# Patient Record
Sex: Male | Born: 1940
Health system: Southern US, Community
[De-identification: ages and names within clinical notes are randomized; demographics above are authoritative.]

## PROBLEM LIST (undated history)

## (undated) DIAGNOSIS — N200 Calculus of kidney: Secondary | ICD-10-CM

## (undated) DIAGNOSIS — K219 Gastro-esophageal reflux disease without esophagitis: Secondary | ICD-10-CM

## (undated) DIAGNOSIS — M722 Plantar fascial fibromatosis: Secondary | ICD-10-CM

## (undated) DIAGNOSIS — Z9989 Dependence on other enabling machines and devices: Secondary | ICD-10-CM

## (undated) DIAGNOSIS — K5792 Diverticulitis of intestine, part unspecified, without perforation or abscess without bleeding: Secondary | ICD-10-CM

## (undated) DIAGNOSIS — H9190 Unspecified hearing loss, unspecified ear: Secondary | ICD-10-CM

## (undated) DIAGNOSIS — J449 Chronic obstructive pulmonary disease, unspecified: Secondary | ICD-10-CM

## (undated) DIAGNOSIS — G4733 Obstructive sleep apnea (adult) (pediatric): Secondary | ICD-10-CM

## (undated) HISTORY — DX: Chronic obstructive pulmonary disease, unspecified: J44.9

## (undated) HISTORY — PX: LITHOTRIPSY: SUR834

## (undated) HISTORY — DX: Obstructive sleep apnea (adult) (pediatric): Z99.89

## (undated) HISTORY — DX: Unspecified hearing loss, unspecified ear: H91.90

## (undated) HISTORY — DX: Plantar fascial fibromatosis: M72.2

## (undated) HISTORY — DX: Diverticulitis of intestine, part unspecified, without perforation or abscess without bleeding: K57.92

## (undated) HISTORY — DX: Calculus of kidney: N20.0

## (undated) HISTORY — DX: Gastro-esophageal reflux disease without esophagitis: K21.9

## (undated) HISTORY — DX: Obstructive sleep apnea (adult) (pediatric): G47.33

## (undated) HISTORY — PX: OTHER SURGICAL HISTORY: SHX169

---

## 2006-03-20 ENCOUNTER — Ambulatory Visit: Payer: Self-pay | Admitting: Otolaryngology

## 2006-04-06 ENCOUNTER — Ambulatory Visit: Payer: Self-pay | Admitting: Otolaryngology

## 2011-06-02 DIAGNOSIS — L820 Inflamed seborrheic keratosis: Secondary | ICD-10-CM | POA: Diagnosis not present

## 2011-06-02 DIAGNOSIS — Z85828 Personal history of other malignant neoplasm of skin: Secondary | ICD-10-CM | POA: Diagnosis not present

## 2011-06-02 DIAGNOSIS — D485 Neoplasm of uncertain behavior of skin: Secondary | ICD-10-CM | POA: Diagnosis not present

## 2011-06-02 DIAGNOSIS — L570 Actinic keratosis: Secondary | ICD-10-CM | POA: Diagnosis not present

## 2011-07-06 DIAGNOSIS — G4730 Sleep apnea, unspecified: Secondary | ICD-10-CM | POA: Diagnosis not present

## 2011-07-06 DIAGNOSIS — G4710 Hypersomnia, unspecified: Secondary | ICD-10-CM | POA: Diagnosis not present

## 2011-07-19 DIAGNOSIS — G4733 Obstructive sleep apnea (adult) (pediatric): Secondary | ICD-10-CM | POA: Diagnosis not present

## 2011-07-19 DIAGNOSIS — R0902 Hypoxemia: Secondary | ICD-10-CM | POA: Diagnosis not present

## 2011-07-19 DIAGNOSIS — J310 Chronic rhinitis: Secondary | ICD-10-CM | POA: Diagnosis not present

## 2011-07-19 DIAGNOSIS — J45909 Unspecified asthma, uncomplicated: Secondary | ICD-10-CM | POA: Diagnosis not present

## 2011-08-03 ENCOUNTER — Ambulatory Visit: Payer: Self-pay | Admitting: Specialist

## 2011-08-03 DIAGNOSIS — G4733 Obstructive sleep apnea (adult) (pediatric): Secondary | ICD-10-CM | POA: Diagnosis not present

## 2011-08-30 DIAGNOSIS — J018 Other acute sinusitis: Secondary | ICD-10-CM | POA: Diagnosis not present

## 2011-08-30 DIAGNOSIS — Z125 Encounter for screening for malignant neoplasm of prostate: Secondary | ICD-10-CM | POA: Diagnosis not present

## 2011-08-30 DIAGNOSIS — Z0000 Encounter for general adult medical examination without abnormal findings: Secondary | ICD-10-CM | POA: Diagnosis not present

## 2011-08-30 DIAGNOSIS — G4733 Obstructive sleep apnea (adult) (pediatric): Secondary | ICD-10-CM | POA: Diagnosis not present

## 2011-08-30 DIAGNOSIS — J328 Other chronic sinusitis: Secondary | ICD-10-CM | POA: Diagnosis not present

## 2011-08-30 DIAGNOSIS — R5383 Other fatigue: Secondary | ICD-10-CM | POA: Diagnosis not present

## 2011-08-30 DIAGNOSIS — J449 Chronic obstructive pulmonary disease, unspecified: Secondary | ICD-10-CM | POA: Diagnosis not present

## 2011-09-28 DIAGNOSIS — Z0000 Encounter for general adult medical examination without abnormal findings: Secondary | ICD-10-CM | POA: Diagnosis not present

## 2011-09-28 DIAGNOSIS — Z125 Encounter for screening for malignant neoplasm of prostate: Secondary | ICD-10-CM | POA: Diagnosis not present

## 2011-10-28 DIAGNOSIS — J45909 Unspecified asthma, uncomplicated: Secondary | ICD-10-CM | POA: Diagnosis not present

## 2011-10-28 DIAGNOSIS — J310 Chronic rhinitis: Secondary | ICD-10-CM | POA: Diagnosis not present

## 2011-10-28 DIAGNOSIS — G4733 Obstructive sleep apnea (adult) (pediatric): Secondary | ICD-10-CM | POA: Diagnosis not present

## 2011-11-14 ENCOUNTER — Ambulatory Visit: Payer: Self-pay | Admitting: Unknown Physician Specialty

## 2011-11-14 DIAGNOSIS — K449 Diaphragmatic hernia without obstruction or gangrene: Secondary | ICD-10-CM | POA: Diagnosis not present

## 2011-11-14 DIAGNOSIS — Z79899 Other long term (current) drug therapy: Secondary | ICD-10-CM | POA: Diagnosis not present

## 2011-11-14 DIAGNOSIS — G4730 Sleep apnea, unspecified: Secondary | ICD-10-CM | POA: Diagnosis not present

## 2011-11-14 DIAGNOSIS — R12 Heartburn: Secondary | ICD-10-CM | POA: Diagnosis not present

## 2011-11-14 DIAGNOSIS — K5289 Other specified noninfective gastroenteritis and colitis: Secondary | ICD-10-CM | POA: Diagnosis not present

## 2011-11-14 DIAGNOSIS — K633 Ulcer of intestine: Secondary | ICD-10-CM | POA: Diagnosis not present

## 2011-11-14 DIAGNOSIS — Z1211 Encounter for screening for malignant neoplasm of colon: Secondary | ICD-10-CM | POA: Diagnosis not present

## 2011-11-14 DIAGNOSIS — J45909 Unspecified asthma, uncomplicated: Secondary | ICD-10-CM | POA: Diagnosis not present

## 2011-11-14 DIAGNOSIS — K219 Gastro-esophageal reflux disease without esophagitis: Secondary | ICD-10-CM | POA: Diagnosis not present

## 2011-11-14 DIAGNOSIS — K648 Other hemorrhoids: Secondary | ICD-10-CM | POA: Diagnosis not present

## 2011-11-14 DIAGNOSIS — J309 Allergic rhinitis, unspecified: Secondary | ICD-10-CM | POA: Diagnosis not present

## 2011-11-14 DIAGNOSIS — Z881 Allergy status to other antibiotic agents status: Secondary | ICD-10-CM | POA: Diagnosis not present

## 2011-11-14 DIAGNOSIS — Z9889 Other specified postprocedural states: Secondary | ICD-10-CM | POA: Diagnosis not present

## 2011-11-14 DIAGNOSIS — K5730 Diverticulosis of large intestine without perforation or abscess without bleeding: Secondary | ICD-10-CM | POA: Diagnosis not present

## 2011-11-14 DIAGNOSIS — N200 Calculus of kidney: Secondary | ICD-10-CM | POA: Diagnosis not present

## 2011-11-23 DIAGNOSIS — D485 Neoplasm of uncertain behavior of skin: Secondary | ICD-10-CM | POA: Diagnosis not present

## 2011-11-23 DIAGNOSIS — Z85828 Personal history of other malignant neoplasm of skin: Secondary | ICD-10-CM | POA: Diagnosis not present

## 2011-11-23 DIAGNOSIS — L570 Actinic keratosis: Secondary | ICD-10-CM | POA: Diagnosis not present

## 2011-11-23 DIAGNOSIS — L820 Inflamed seborrheic keratosis: Secondary | ICD-10-CM | POA: Diagnosis not present

## 2012-01-18 DIAGNOSIS — J45909 Unspecified asthma, uncomplicated: Secondary | ICD-10-CM | POA: Diagnosis not present

## 2012-01-18 DIAGNOSIS — R498 Other voice and resonance disorders: Secondary | ICD-10-CM | POA: Diagnosis not present

## 2012-01-18 DIAGNOSIS — J310 Chronic rhinitis: Secondary | ICD-10-CM | POA: Diagnosis not present

## 2012-01-18 DIAGNOSIS — G4733 Obstructive sleep apnea (adult) (pediatric): Secondary | ICD-10-CM | POA: Diagnosis not present

## 2012-01-23 DIAGNOSIS — H40009 Preglaucoma, unspecified, unspecified eye: Secondary | ICD-10-CM | POA: Diagnosis not present

## 2012-03-15 DIAGNOSIS — Z23 Encounter for immunization: Secondary | ICD-10-CM | POA: Diagnosis not present

## 2012-04-27 DIAGNOSIS — H40009 Preglaucoma, unspecified, unspecified eye: Secondary | ICD-10-CM | POA: Diagnosis not present

## 2012-07-04 DIAGNOSIS — L259 Unspecified contact dermatitis, unspecified cause: Secondary | ICD-10-CM | POA: Diagnosis not present

## 2012-07-04 DIAGNOSIS — B356 Tinea cruris: Secondary | ICD-10-CM | POA: Diagnosis not present

## 2012-07-18 DIAGNOSIS — G4733 Obstructive sleep apnea (adult) (pediatric): Secondary | ICD-10-CM | POA: Diagnosis not present

## 2012-07-18 DIAGNOSIS — K219 Gastro-esophageal reflux disease without esophagitis: Secondary | ICD-10-CM | POA: Diagnosis not present

## 2012-07-18 DIAGNOSIS — J45909 Unspecified asthma, uncomplicated: Secondary | ICD-10-CM | POA: Diagnosis not present

## 2012-08-22 DIAGNOSIS — L570 Actinic keratosis: Secondary | ICD-10-CM | POA: Diagnosis not present

## 2012-08-22 DIAGNOSIS — L259 Unspecified contact dermatitis, unspecified cause: Secondary | ICD-10-CM | POA: Diagnosis not present

## 2012-08-22 DIAGNOSIS — Z85828 Personal history of other malignant neoplasm of skin: Secondary | ICD-10-CM | POA: Diagnosis not present

## 2012-10-03 DIAGNOSIS — Z13220 Encounter for screening for lipoid disorders: Secondary | ICD-10-CM | POA: Diagnosis not present

## 2012-10-03 DIAGNOSIS — S41109A Unspecified open wound of unspecified upper arm, initial encounter: Secondary | ICD-10-CM | POA: Diagnosis not present

## 2012-10-03 DIAGNOSIS — Z125 Encounter for screening for malignant neoplasm of prostate: Secondary | ICD-10-CM | POA: Diagnosis not present

## 2012-10-03 DIAGNOSIS — G4700 Insomnia, unspecified: Secondary | ICD-10-CM | POA: Diagnosis not present

## 2012-10-03 DIAGNOSIS — J449 Chronic obstructive pulmonary disease, unspecified: Secondary | ICD-10-CM | POA: Diagnosis not present

## 2012-10-03 DIAGNOSIS — Z0000 Encounter for general adult medical examination without abnormal findings: Secondary | ICD-10-CM | POA: Diagnosis not present

## 2012-10-03 DIAGNOSIS — Z136 Encounter for screening for cardiovascular disorders: Secondary | ICD-10-CM | POA: Diagnosis not present

## 2012-10-03 DIAGNOSIS — J4489 Other specified chronic obstructive pulmonary disease: Secondary | ICD-10-CM | POA: Diagnosis not present

## 2012-10-22 DIAGNOSIS — H903 Sensorineural hearing loss, bilateral: Secondary | ICD-10-CM | POA: Diagnosis not present

## 2012-11-16 DIAGNOSIS — H905 Unspecified sensorineural hearing loss: Secondary | ICD-10-CM | POA: Diagnosis not present

## 2013-01-16 DIAGNOSIS — J45909 Unspecified asthma, uncomplicated: Secondary | ICD-10-CM | POA: Diagnosis not present

## 2013-01-16 DIAGNOSIS — G4733 Obstructive sleep apnea (adult) (pediatric): Secondary | ICD-10-CM | POA: Diagnosis not present

## 2013-01-30 DIAGNOSIS — Z23 Encounter for immunization: Secondary | ICD-10-CM | POA: Diagnosis not present

## 2013-02-04 DIAGNOSIS — M722 Plantar fascial fibromatosis: Secondary | ICD-10-CM | POA: Diagnosis not present

## 2013-02-04 DIAGNOSIS — M7730 Calcaneal spur, unspecified foot: Secondary | ICD-10-CM | POA: Diagnosis not present

## 2013-02-28 DIAGNOSIS — M7730 Calcaneal spur, unspecified foot: Secondary | ICD-10-CM | POA: Diagnosis not present

## 2013-03-09 DIAGNOSIS — Z23 Encounter for immunization: Secondary | ICD-10-CM | POA: Diagnosis not present

## 2013-03-09 DIAGNOSIS — IMO0002 Reserved for concepts with insufficient information to code with codable children: Secondary | ICD-10-CM | POA: Diagnosis not present

## 2013-03-09 DIAGNOSIS — T07XXXA Unspecified multiple injuries, initial encounter: Secondary | ICD-10-CM | POA: Diagnosis not present

## 2013-03-12 DIAGNOSIS — S81009A Unspecified open wound, unspecified knee, initial encounter: Secondary | ICD-10-CM | POA: Diagnosis not present

## 2013-03-12 DIAGNOSIS — S61409A Unspecified open wound of unspecified hand, initial encounter: Secondary | ICD-10-CM | POA: Diagnosis not present

## 2013-03-12 DIAGNOSIS — Z4800 Encounter for change or removal of nonsurgical wound dressing: Secondary | ICD-10-CM | POA: Diagnosis not present

## 2013-05-31 DIAGNOSIS — H2510 Age-related nuclear cataract, unspecified eye: Secondary | ICD-10-CM | POA: Diagnosis not present

## 2013-06-18 DIAGNOSIS — R059 Cough, unspecified: Secondary | ICD-10-CM | POA: Diagnosis not present

## 2013-06-18 DIAGNOSIS — R509 Fever, unspecified: Secondary | ICD-10-CM | POA: Diagnosis not present

## 2013-06-18 DIAGNOSIS — R05 Cough: Secondary | ICD-10-CM | POA: Diagnosis not present

## 2013-06-18 DIAGNOSIS — J180 Bronchopneumonia, unspecified organism: Secondary | ICD-10-CM | POA: Diagnosis not present

## 2013-06-18 DIAGNOSIS — J45909 Unspecified asthma, uncomplicated: Secondary | ICD-10-CM | POA: Diagnosis not present

## 2013-07-03 DIAGNOSIS — G4733 Obstructive sleep apnea (adult) (pediatric): Secondary | ICD-10-CM | POA: Diagnosis not present

## 2013-07-03 DIAGNOSIS — R059 Cough, unspecified: Secondary | ICD-10-CM | POA: Diagnosis not present

## 2013-07-03 DIAGNOSIS — J45909 Unspecified asthma, uncomplicated: Secondary | ICD-10-CM | POA: Diagnosis not present

## 2013-07-03 DIAGNOSIS — R05 Cough: Secondary | ICD-10-CM | POA: Diagnosis not present

## 2013-07-03 DIAGNOSIS — J180 Bronchopneumonia, unspecified organism: Secondary | ICD-10-CM | POA: Diagnosis not present

## 2013-07-15 DIAGNOSIS — I209 Angina pectoris, unspecified: Secondary | ICD-10-CM | POA: Diagnosis not present

## 2013-08-01 DIAGNOSIS — J310 Chronic rhinitis: Secondary | ICD-10-CM | POA: Diagnosis not present

## 2013-08-01 DIAGNOSIS — G4733 Obstructive sleep apnea (adult) (pediatric): Secondary | ICD-10-CM | POA: Diagnosis not present

## 2013-08-01 DIAGNOSIS — J449 Chronic obstructive pulmonary disease, unspecified: Secondary | ICD-10-CM | POA: Diagnosis not present

## 2013-08-06 DIAGNOSIS — M259 Joint disorder, unspecified: Secondary | ICD-10-CM | POA: Diagnosis not present

## 2013-08-06 DIAGNOSIS — K644 Residual hemorrhoidal skin tags: Secondary | ICD-10-CM | POA: Diagnosis not present

## 2013-08-22 DIAGNOSIS — B356 Tinea cruris: Secondary | ICD-10-CM | POA: Diagnosis not present

## 2013-08-22 DIAGNOSIS — L570 Actinic keratosis: Secondary | ICD-10-CM | POA: Diagnosis not present

## 2013-08-22 DIAGNOSIS — Z85828 Personal history of other malignant neoplasm of skin: Secondary | ICD-10-CM | POA: Diagnosis not present

## 2013-10-09 DIAGNOSIS — Z125 Encounter for screening for malignant neoplasm of prostate: Secondary | ICD-10-CM | POA: Diagnosis not present

## 2013-10-09 DIAGNOSIS — Z13220 Encounter for screening for lipoid disorders: Secondary | ICD-10-CM | POA: Diagnosis not present

## 2013-10-09 DIAGNOSIS — Z0000 Encounter for general adult medical examination without abnormal findings: Secondary | ICD-10-CM | POA: Diagnosis not present

## 2013-10-09 DIAGNOSIS — R7989 Other specified abnormal findings of blood chemistry: Secondary | ICD-10-CM | POA: Diagnosis not present

## 2013-10-09 DIAGNOSIS — M2540 Effusion, unspecified joint: Secondary | ICD-10-CM | POA: Diagnosis not present

## 2013-10-09 DIAGNOSIS — J45909 Unspecified asthma, uncomplicated: Secondary | ICD-10-CM | POA: Diagnosis not present

## 2013-12-04 DIAGNOSIS — R109 Unspecified abdominal pain: Secondary | ICD-10-CM | POA: Diagnosis not present

## 2014-01-28 DIAGNOSIS — J449 Chronic obstructive pulmonary disease, unspecified: Secondary | ICD-10-CM | POA: Diagnosis not present

## 2014-01-28 DIAGNOSIS — K091 Developmental (nonodontogenic) cysts of oral region: Secondary | ICD-10-CM | POA: Diagnosis not present

## 2014-01-28 DIAGNOSIS — Z23 Encounter for immunization: Secondary | ICD-10-CM | POA: Diagnosis not present

## 2014-02-05 DIAGNOSIS — J4520 Mild intermittent asthma, uncomplicated: Secondary | ICD-10-CM | POA: Insufficient documentation

## 2014-02-05 DIAGNOSIS — G4733 Obstructive sleep apnea (adult) (pediatric): Secondary | ICD-10-CM | POA: Diagnosis not present

## 2014-02-05 DIAGNOSIS — R0602 Shortness of breath: Secondary | ICD-10-CM | POA: Diagnosis not present

## 2014-02-05 DIAGNOSIS — J45909 Unspecified asthma, uncomplicated: Secondary | ICD-10-CM | POA: Diagnosis not present

## 2014-02-05 DIAGNOSIS — J310 Chronic rhinitis: Secondary | ICD-10-CM | POA: Diagnosis not present

## 2014-05-22 DIAGNOSIS — J0140 Acute pansinusitis, unspecified: Secondary | ICD-10-CM | POA: Diagnosis not present

## 2014-08-21 DIAGNOSIS — L821 Other seborrheic keratosis: Secondary | ICD-10-CM | POA: Diagnosis not present

## 2014-08-21 DIAGNOSIS — D2272 Melanocytic nevi of left lower limb, including hip: Secondary | ICD-10-CM | POA: Diagnosis not present

## 2014-08-21 DIAGNOSIS — Z85828 Personal history of other malignant neoplasm of skin: Secondary | ICD-10-CM | POA: Diagnosis not present

## 2014-08-21 DIAGNOSIS — L570 Actinic keratosis: Secondary | ICD-10-CM | POA: Diagnosis not present

## 2014-08-21 DIAGNOSIS — X32XXXA Exposure to sunlight, initial encounter: Secondary | ICD-10-CM | POA: Diagnosis not present

## 2014-08-21 DIAGNOSIS — D2261 Melanocytic nevi of right upper limb, including shoulder: Secondary | ICD-10-CM | POA: Diagnosis not present

## 2014-09-04 DIAGNOSIS — J4520 Mild intermittent asthma, uncomplicated: Secondary | ICD-10-CM | POA: Diagnosis not present

## 2014-09-04 DIAGNOSIS — G4733 Obstructive sleep apnea (adult) (pediatric): Secondary | ICD-10-CM | POA: Diagnosis not present

## 2014-09-11 DIAGNOSIS — J449 Chronic obstructive pulmonary disease, unspecified: Secondary | ICD-10-CM | POA: Insufficient documentation

## 2014-09-11 DIAGNOSIS — H9190 Unspecified hearing loss, unspecified ear: Secondary | ICD-10-CM | POA: Insufficient documentation

## 2014-09-11 DIAGNOSIS — Z9989 Dependence on other enabling machines and devices: Secondary | ICD-10-CM

## 2014-09-11 DIAGNOSIS — G4733 Obstructive sleep apnea (adult) (pediatric): Secondary | ICD-10-CM | POA: Insufficient documentation

## 2014-09-11 DIAGNOSIS — K219 Gastro-esophageal reflux disease without esophagitis: Secondary | ICD-10-CM | POA: Insufficient documentation

## 2014-10-23 ENCOUNTER — Encounter: Payer: Self-pay | Admitting: Family Medicine

## 2014-10-29 ENCOUNTER — Ambulatory Visit (INDEPENDENT_AMBULATORY_CARE_PROVIDER_SITE_OTHER): Payer: Medicare Other | Admitting: Family Medicine

## 2014-10-29 ENCOUNTER — Encounter: Payer: Self-pay | Admitting: Family Medicine

## 2014-10-29 VITALS — BP 138/74 | HR 72 | Temp 97.7°F | Ht 69.0 in | Wt 192.0 lb

## 2014-10-29 DIAGNOSIS — Z9989 Dependence on other enabling machines and devices: Principal | ICD-10-CM

## 2014-10-29 DIAGNOSIS — K219 Gastro-esophageal reflux disease without esophagitis: Secondary | ICD-10-CM | POA: Diagnosis not present

## 2014-10-29 DIAGNOSIS — Z0000 Encounter for general adult medical examination without abnormal findings: Secondary | ICD-10-CM

## 2014-10-29 DIAGNOSIS — G4733 Obstructive sleep apnea (adult) (pediatric): Secondary | ICD-10-CM

## 2014-10-29 DIAGNOSIS — J309 Allergic rhinitis, unspecified: Secondary | ICD-10-CM | POA: Diagnosis not present

## 2014-10-29 LAB — URINALYSIS, ROUTINE W REFLEX MICROSCOPIC
BILIRUBIN UA: NEGATIVE
GLUCOSE, UA: NEGATIVE
Ketones, UA: NEGATIVE
Leukocytes, UA: NEGATIVE
Nitrite, UA: NEGATIVE
PROTEIN UA: NEGATIVE
RBC, UA: NEGATIVE
Specific Gravity, UA: 1.015 (ref 1.005–1.030)
UUROB: 0.2 mg/dL (ref 0.2–1.0)
pH, UA: 7 (ref 5.0–7.5)

## 2014-10-29 MED ORDER — FLUTICASONE-SALMETEROL 250-50 MCG/DOSE IN AEPB: 1.0000 | INHALATION_SPRAY | Freq: Two times a day (BID) | RESPIRATORY_TRACT | Status: DC

## 2014-10-29 MED ORDER — OMEPRAZOLE 40 MG PO CPDR: 40.0000 mg | DELAYED_RELEASE_CAPSULE | Freq: Every day | ORAL | Status: DC

## 2014-10-29 MED ORDER — FLUTICASONE PROPIONATE 50 MCG/ACT NA SUSP: 2.0000 | Freq: Every day | NASAL | Status: DC

## 2014-10-29 MED ORDER — MONTELUKAST SODIUM 10 MG PO TABS: 10.0000 mg | ORAL_TABLET | Freq: Every day | ORAL | Status: DC

## 2014-10-29 NOTE — Progress Notes (Signed)
BP 138/74 mmHg  Pulse 72  Temp(Src) 97.7 F (36.5 C)  Ht 5\' 9"  (1.753 m)  Wt 192 lb (87.091 kg)  BMI 28.34 kg/m2  SpO2 96%   Subjective:    Patient ID: Carl Fernandez, male    DOB: 1941/02/08, 74 y.o.   MRN: [id number]  HPI: [name] is a 74 y.o. male  Chief Complaint  Patient presents with  . Annual Exam  medicare AWV all parts done but computer issues Depression and falls risk done and notrmal  OSA having dry mouth problems Discussed pt will go to sleep center and have humidify er checked Allergies and reflux stable meds doing fine and no side effects   Relevant past medical, surgical, family and social history reviewed and updated as indicated. Interim medical history since our last visit reviewed. Allergies and medications reviewed and updated.  Review of Systems  Constitutional: Negative.   HENT: Negative.   Eyes: Negative.   Respiratory: Negative.   Cardiovascular: Negative.   Endocrine: Negative.   Musculoskeletal: Negative.   Skin: Negative.   Allergic/Immunologic: Negative.   Neurological: Negative.   Hematological: Negative.   Psychiatric/Behavioral: Negative.     Per HPI unless specifically indicated above     Objective:    BP 138/74 mmHg  Pulse 72  Temp(Src) 97.7 F (36.5 C)  Ht 5\' 9"  (1.753 m)  Wt 192 lb (87.091 kg)  BMI 28.34 kg/m2  SpO2 96%  Wt Readings from Last 3 Encounters:  10/29/14 192 lb (87.091 kg)  05/22/14 194 lb (87.998 kg)    Physical Exam  Constitutional: He is oriented to person, place, and time. He appears well-developed and well-nourished.  HENT:  Head: Normocephalic and atraumatic.  Right Ear: External ear normal.  Left Ear: External ear normal.  Eyes: Conjunctivae and EOM are normal. Pupils are equal, round, and reactive to light.  Neck: Normal range of motion. Neck supple.  Cardiovascular: Normal rate, regular rhythm, normal heart sounds and intact distal pulses.   Pulmonary/Chest: Effort normal and breath  sounds normal.  Abdominal: Soft. Bowel sounds are normal. There is no splenomegaly or hepatomegaly.  Genitourinary: Rectum normal, prostate normal and penis normal.  Musculoskeletal: Normal range of motion.  Neurological: He is alert and oriented to person, place, and time. He has normal reflexes.  Skin: No rash noted. No erythema.  Psychiatric: He has a normal mood and affect. His behavior is normal. Judgment and thought content normal.        Assessment & Plan:   Problem List Items Addressed This Visit      Respiratory   OSA on CPAP - Primary    OSA having dry mouth problems Discussed pt will go to sleep center and have humidify er checked      Relevant Orders   Comprehensive metabolic panel   CBC with Differential/Platelet   Urinalysis, Routine w reflex microscopic (not at [facility])   Allergic rhinitis   Relevant Medications   fluticasone (FLONASE) 50 MCG/ACT nasal spray   montelukast (SINGULAIR) 10 MG tablet   Fluticasone-Salmeterol (ADVAIR) 250-50 MCG/DOSE AEPB   Other Relevant Orders   Comprehensive metabolic panel   CBC with Differential/Platelet   Urinalysis, Routine w reflex microscopic (not at [facility])     Digestive   GERD (gastroesophageal reflux disease)    The current medical regimen is effective;  continue present plan and medications.       Relevant Medications   omeprazole (PRILOSEC) 40 MG capsule   Other Relevant Orders  Comprehensive metabolic panel   CBC with Differential/Platelet   Urinalysis, Routine w reflex microscopic (not at [facility])       Follow up plan: Return in about 6 months (around 04/30/2015) for med check.

## 2014-10-29 NOTE — Assessment & Plan Note (Signed)
OSA having dry mouth problems Discussed pt will go to sleep center and have humidify er checked

## 2014-10-29 NOTE — Assessment & Plan Note (Signed)
The current medical regimen is effective;  continue present plan and medications.  

## 2014-10-30 LAB — CBC WITH DIFFERENTIAL/PLATELET
BASOS: 0 %
Basophils Absolute: 0 10*3/uL (ref 0.0–0.2)
EOS (ABSOLUTE): 0.2 10*3/uL (ref 0.0–0.4)
Eos: 3 %
HEMOGLOBIN: 14.6 g/dL (ref 12.6–17.7)
Hematocrit: 42.7 % (ref 37.5–51.0)
IMMATURE GRANULOCYTES: 0 %
Immature Grans (Abs): 0 10*3/uL (ref 0.0–0.1)
LYMPHS: 24 %
Lymphocytes Absolute: 1.4 10*3/uL (ref 0.7–3.1)
MCH: 29.2 pg (ref 26.6–33.0)
MCHC: 34.2 g/dL (ref 31.5–35.7)
MCV: 85 fL (ref 79–97)
MONOS ABS: 0.4 10*3/uL (ref 0.1–0.9)
Monocytes: 7 %
NEUTROS PCT: 66 %
Neutrophils Absolute: 3.9 10*3/uL (ref 1.4–7.0)
Platelets: 199 10*3/uL (ref 150–379)
RBC: 5 x10E6/uL (ref 4.14–5.80)
RDW: 13.7 % (ref 12.3–15.4)
WBC: 5.8 10*3/uL (ref 3.4–10.8)

## 2014-10-30 LAB — COMPREHENSIVE METABOLIC PANEL
ALBUMIN: 4.3 g/dL (ref 3.5–4.8)
ALK PHOS: 71 IU/L (ref 39–117)
ALT: 10 IU/L (ref 0–44)
AST: 16 IU/L (ref 0–40)
Albumin/Globulin Ratio: 2 (ref 1.1–2.5)
BILIRUBIN TOTAL: 0.4 mg/dL (ref 0.0–1.2)
BUN / CREAT RATIO: 10 (ref 10–22)
BUN: 11 mg/dL (ref 8–27)
CHLORIDE: 101 mmol/L (ref 97–108)
CO2: 26 mmol/L (ref 18–29)
Calcium: 9.1 mg/dL (ref 8.6–10.2)
Creatinine, Ser: 1.11 mg/dL (ref 0.76–1.27)
GFR calc Af Amer: 75 mL/min/{1.73_m2} (ref >59)
GFR calc non Af Amer: 65 mL/min/{1.73_m2} (ref >59)
Globulin, Total: 2.1 g/dL (ref 1.5–4.5)
Glucose: 83 mg/dL (ref 65–99)
POTASSIUM: 4.2 mmol/L (ref 3.5–5.2)
SODIUM: 142 mmol/L (ref 134–144)
Total Protein: 6.4 g/dL (ref 6.0–8.5)

## 2015-04-08 DIAGNOSIS — J4520 Mild intermittent asthma, uncomplicated: Secondary | ICD-10-CM | POA: Diagnosis not present

## 2015-04-08 DIAGNOSIS — G4733 Obstructive sleep apnea (adult) (pediatric): Secondary | ICD-10-CM | POA: Diagnosis not present

## 2015-04-08 DIAGNOSIS — R0602 Shortness of breath: Secondary | ICD-10-CM | POA: Diagnosis not present

## 2015-04-08 DIAGNOSIS — J310 Chronic rhinitis: Secondary | ICD-10-CM | POA: Diagnosis not present

## 2015-04-08 DIAGNOSIS — R05 Cough: Secondary | ICD-10-CM | POA: Diagnosis not present

## 2015-04-29 ENCOUNTER — Encounter: Payer: Self-pay | Admitting: Family Medicine

## 2015-04-29 ENCOUNTER — Ambulatory Visit (INDEPENDENT_AMBULATORY_CARE_PROVIDER_SITE_OTHER): Payer: Medicare Other | Admitting: Family Medicine

## 2015-04-29 VITALS — BP 146/69 | HR 80 | Temp 97.8°F | Ht 68.7 in | Wt 196.0 lb

## 2015-04-29 DIAGNOSIS — R030 Elevated blood-pressure reading, without diagnosis of hypertension: Secondary | ICD-10-CM

## 2015-04-29 DIAGNOSIS — Z23 Encounter for immunization: Secondary | ICD-10-CM

## 2015-04-29 DIAGNOSIS — IMO0001 Reserved for inherently not codable concepts without codable children: Secondary | ICD-10-CM

## 2015-04-29 DIAGNOSIS — K219 Gastro-esophageal reflux disease without esophagitis: Secondary | ICD-10-CM

## 2015-04-29 DIAGNOSIS — J42 Unspecified chronic bronchitis: Secondary | ICD-10-CM

## 2015-04-29 NOTE — Assessment & Plan Note (Signed)
The current medical regimen is effective;  continue present plan and medications.  

## 2015-04-29 NOTE — Progress Notes (Signed)
BP 146/69 mmHg  Pulse 80  Temp(Src) 97.8 F (36.6 C)  Ht 5' 8.7" (1.745 m)  Wt 196 lb (88.905 kg)  BMI 29.20 kg/m2  SpO2 99%   Subjective:    Patient ID: Carl Fernandez, male    DOB: 1940-09-11, 74 y.o.   MRN: [id number]  HPI: Carl Fernandez is a 74 y.o. male  No chief complaint on file.  Patient doing well good reports from Dr. Raul Del from pulmonary Patient's reflux not aware of any issues takes Prilosec without problems Uses CPAP regularly Allergies are well controlled with Flonase Blood pressure elevated slightly today on chart review and pulmonary was normal and previous blood pressures here were good Patient has blood pressure cuff at home will monitor blood pressure and if elevated will notify us for consideration of medications. Relevant past medical, surgical, family and social history reviewed and updated as indicated. Interim medical history since our last visit reviewed. Allergies and medications reviewed and updated.  Review of Systems  Constitutional: Negative.   Respiratory: Negative.   Cardiovascular: Negative.     Per HPI unless specifically indicated above     Objective:    BP 146/69 mmHg  Pulse 80  Temp(Src) 97.8 F (36.6 C)  Ht 5' 8.7" (1.745 m)  Wt 196 lb (88.905 kg)  BMI 29.20 kg/m2  SpO2 99%  Wt Readings from Last 3 Encounters:  04/29/15 196 lb (88.905 kg)  10/29/14 192 lb (87.091 kg)  05/22/14 194 lb (87.998 kg)    Physical Exam  Constitutional: He is oriented to person, place, and time. He appears well-developed and well-nourished. No distress.  HENT:  Head: Normocephalic and atraumatic.  Right Ear: Hearing normal.  Left Ear: Hearing normal.  Nose: Nose normal.  Eyes: Conjunctivae and lids are normal. Right eye exhibits no discharge. Left eye exhibits no discharge. No scleral icterus.  Cardiovascular: Normal rate, regular rhythm and normal heart sounds.   Pulmonary/Chest: Effort normal and breath sounds normal. No respiratory  distress.  Musculoskeletal: Normal range of motion.  Neurological: He is alert and oriented to person, place, and time.  Skin: Skin is intact. No rash noted.  Psychiatric: He has a normal mood and affect. His speech is normal and behavior is normal. Judgment and thought content normal. Cognition and memory are normal.    Results for orders placed or performed in visit on 10/29/14  Comprehensive metabolic panel  Result Value Ref Range   Glucose 83 65 - 99 mg/dL   BUN 11 8 - 27 mg/dL   Creatinine, Ser 1.11 0.76 - 1.27 mg/dL   GFR calc non Af Amer 65 >59 mL/min/1.73   GFR calc Af Amer 75 >59 mL/min/1.73   BUN/Creatinine Ratio 10 10 - 22   Sodium 142 134 - 144 mmol/L   Potassium 4.2 3.5 - 5.2 mmol/L   Chloride 101 97 - 108 mmol/L   CO2 26 18 - 29 mmol/L   Calcium 9.1 8.6 - 10.2 mg/dL   Total Protein 6.4 6.0 - 8.5 g/dL   Albumin 4.3 3.5 - 4.8 g/dL   Globulin, Total 2.1 1.5 - 4.5 g/dL   Albumin/Globulin Ratio 2.0 1.1 - 2.5   Bilirubin Total 0.4 0.0 - 1.2 mg/dL   Alkaline Phosphatase 71 39 - 117 IU/L   AST 16 0 - 40 IU/L   ALT 10 0 - 44 IU/L  CBC with Differential/Platelet  Result Value Ref Range   WBC 5.8 3.4 - 10.8 x10E3/uL   RBC 5.00 4.14 -  5.80 x10E6/uL   Hemoglobin 14.6 12.6 - 17.7 g/dL   Hematocrit 42.7 37.5 - 51.0 %   MCV 85 79 - 97 fL   MCH 29.2 26.6 - 33.0 pg   MCHC 34.2 31.5 - 35.7 g/dL   RDW 13.7 12.3 - 15.4 %   Platelets 199 150 - 379 x10E3/uL   Neutrophils 66 %   Lymphs 24 %   Monocytes 7 %   Eos 3 %   Basos 0 %   Neutrophils Absolute 3.9 1.4 - 7.0 x10E3/uL   Lymphocytes Absolute 1.4 0.7 - 3.1 x10E3/uL   Monocytes Absolute 0.4 0.1 - 0.9 x10E3/uL   EOS (ABSOLUTE) 0.2 0.0 - 0.4 x10E3/uL   Basophils Absolute 0.0 0.0 - 0.2 x10E3/uL   Immature Granulocytes 0 %   Immature Grans (Abs) 0.0 0.0 - 0.1 x10E3/uL  Urinalysis, Routine w reflex microscopic (not at [facility])  Result Value Ref Range   Specific Gravity, UA 1.015 1.005 - 1.030   pH, UA 7.0 5.0 - 7.5   Color, UA  Yellow Yellow   Appearance Ur Clear Clear   Leukocytes, UA Negative Negative   Protein, UA Negative Negative/Trace   Glucose, UA Negative Negative   Ketones, UA Negative Negative   RBC, UA Negative Negative   Bilirubin, UA Negative Negative   Urobilinogen, Ur 0.2 0.2 - 1.0 mg/dL   Nitrite, UA Negative Negative      Assessment & Plan:   Problem List Items Addressed This Visit      Respiratory   COPD (chronic obstructive pulmonary disease) ([place])    The current medical regimen is effective;  continue present plan and medications.         Digestive   GERD (gastroesophageal reflux disease)    The current medical regimen is effective;  continue present plan and medications.         Other   Elevated BP    Discussed blood pressure patient will observe it stays high will notify us discussed lifestyle and DASH diet       Other Visit Diagnoses    Immunization due    -  Primary    Relevant Orders    Flu Vaccine QUAD 36+ mos PF IM (Fluarix & Fluzone Quad PF) (Completed)    Pneumococcal conjugate vaccine 13-valent IM (Completed)        Follow up plan: Return in about 6 months (around 10/28/2015) for Physical Exam.

## 2015-04-29 NOTE — Assessment & Plan Note (Signed)
Discussed blood pressure patient will observe it stays high will notify us discussed lifestyle and DASH diet

## 2015-07-16 ENCOUNTER — Encounter: Payer: Self-pay | Admitting: *Deleted

## 2015-07-17 DIAGNOSIS — L570 Actinic keratosis: Secondary | ICD-10-CM | POA: Diagnosis not present

## 2015-07-17 DIAGNOSIS — D225 Melanocytic nevi of trunk: Secondary | ICD-10-CM | POA: Diagnosis not present

## 2015-07-17 DIAGNOSIS — D2272 Melanocytic nevi of left lower limb, including hip: Secondary | ICD-10-CM | POA: Diagnosis not present

## 2015-07-17 DIAGNOSIS — X32XXXA Exposure to sunlight, initial encounter: Secondary | ICD-10-CM | POA: Diagnosis not present

## 2015-07-17 DIAGNOSIS — Z85828 Personal history of other malignant neoplasm of skin: Secondary | ICD-10-CM | POA: Diagnosis not present

## 2015-07-17 DIAGNOSIS — L821 Other seborrheic keratosis: Secondary | ICD-10-CM | POA: Diagnosis not present

## 2015-08-04 ENCOUNTER — Encounter: Payer: Self-pay | Admitting: Family Medicine

## 2015-09-18 DIAGNOSIS — H2513 Age-related nuclear cataract, bilateral: Secondary | ICD-10-CM | POA: Diagnosis not present

## 2015-09-28 DIAGNOSIS — G4733 Obstructive sleep apnea (adult) (pediatric): Secondary | ICD-10-CM | POA: Diagnosis not present

## 2015-09-28 DIAGNOSIS — J4520 Mild intermittent asthma, uncomplicated: Secondary | ICD-10-CM | POA: Diagnosis not present

## 2015-09-28 DIAGNOSIS — R05 Cough: Secondary | ICD-10-CM | POA: Diagnosis not present

## 2015-11-02 ENCOUNTER — Encounter: Payer: Medicare Other | Admitting: Family Medicine

## 2015-12-07 ENCOUNTER — Other Ambulatory Visit: Payer: Self-pay | Admitting: Family Medicine

## 2015-12-07 DIAGNOSIS — J309 Allergic rhinitis, unspecified: Secondary | ICD-10-CM

## 2015-12-29 ENCOUNTER — Encounter (INDEPENDENT_AMBULATORY_CARE_PROVIDER_SITE_OTHER): Payer: Self-pay

## 2016-01-14 ENCOUNTER — Other Ambulatory Visit: Payer: Self-pay | Admitting: Family Medicine

## 2016-01-14 DIAGNOSIS — J309 Allergic rhinitis, unspecified: Secondary | ICD-10-CM

## 2016-01-18 ENCOUNTER — Other Ambulatory Visit: Payer: Self-pay | Admitting: Family Medicine

## 2016-01-18 DIAGNOSIS — K219 Gastro-esophageal reflux disease without esophagitis: Secondary | ICD-10-CM

## 2016-01-18 DIAGNOSIS — J309 Allergic rhinitis, unspecified: Secondary | ICD-10-CM

## 2016-01-26 ENCOUNTER — Ambulatory Visit (INDEPENDENT_AMBULATORY_CARE_PROVIDER_SITE_OTHER): Payer: Medicare Other | Admitting: Family Medicine

## 2016-01-26 ENCOUNTER — Encounter: Payer: Self-pay | Admitting: Family Medicine

## 2016-01-26 VITALS — BP 134/73 | HR 77 | Temp 97.8°F | Ht 69.2 in | Wt 199.0 lb

## 2016-01-26 DIAGNOSIS — K219 Gastro-esophageal reflux disease without esophagitis: Secondary | ICD-10-CM

## 2016-01-26 DIAGNOSIS — J449 Chronic obstructive pulmonary disease, unspecified: Secondary | ICD-10-CM | POA: Diagnosis not present

## 2016-01-26 DIAGNOSIS — J309 Allergic rhinitis, unspecified: Secondary | ICD-10-CM

## 2016-01-26 DIAGNOSIS — Z9989 Dependence on other enabling machines and devices: Secondary | ICD-10-CM

## 2016-01-26 DIAGNOSIS — E7800 Pure hypercholesterolemia, unspecified: Secondary | ICD-10-CM | POA: Diagnosis not present

## 2016-01-26 DIAGNOSIS — Z0000 Encounter for general adult medical examination without abnormal findings: Secondary | ICD-10-CM | POA: Diagnosis not present

## 2016-01-26 DIAGNOSIS — N400 Enlarged prostate without lower urinary tract symptoms: Secondary | ICD-10-CM | POA: Diagnosis not present

## 2016-01-26 DIAGNOSIS — J42 Unspecified chronic bronchitis: Secondary | ICD-10-CM

## 2016-01-26 DIAGNOSIS — G4733 Obstructive sleep apnea (adult) (pediatric): Secondary | ICD-10-CM | POA: Diagnosis not present

## 2016-01-26 DIAGNOSIS — Z23 Encounter for immunization: Secondary | ICD-10-CM

## 2016-01-26 DIAGNOSIS — R5383 Other fatigue: Secondary | ICD-10-CM

## 2016-01-26 LAB — URINALYSIS, ROUTINE W REFLEX MICROSCOPIC
Bilirubin, UA: NEGATIVE
GLUCOSE, UA: NEGATIVE
KETONES UA: NEGATIVE
Leukocytes, UA: NEGATIVE
Nitrite, UA: NEGATIVE
SPEC GRAV UA: 1.015 (ref 1.005–1.030)
UUROB: 0.2 mg/dL (ref 0.2–1.0)
pH, UA: 7.5 (ref 5.0–7.5)

## 2016-01-26 LAB — MICROSCOPIC EXAMINATION: Epithelial Cells (non renal): NONE SEEN /hpf (ref 0–10)

## 2016-01-26 MED ORDER — FLUTICASONE PROPIONATE 50 MCG/ACT NA SUSP
2.0000 | Freq: Every day | NASAL | 4 refills | Status: DC
Start: 2016-01-26 — End: 2017-12-25

## 2016-01-26 MED ORDER — FLUTICASONE-SALMETEROL 250-50 MCG/DOSE IN AEPB
1.0000 | INHALATION_SPRAY | Freq: Two times a day (BID) | RESPIRATORY_TRACT | 4 refills | Status: DC
Start: 2016-01-26 — End: 2017-01-31

## 2016-01-26 MED ORDER — OMEPRAZOLE 40 MG PO CPDR: 40.0000 mg | DELAYED_RELEASE_CAPSULE | Freq: Every day | ORAL | 4 refills | Status: DC

## 2016-01-26 NOTE — Assessment & Plan Note (Signed)
Discuss possible use of chin strap other type breathing devices dependent on pressure settings

## 2016-01-26 NOTE — Patient Instructions (Addendum)

## 2016-01-26 NOTE — Assessment & Plan Note (Signed)
The current medical regimen is effective;  continue present plan and medications.  

## 2016-01-26 NOTE — Progress Notes (Signed)
BP 134/73 (BP Location: Right Arm, Cuff Size: Normal)   Pulse 77   Temp 97.8 F (36.6 C)   Ht 5' 9.2" (1.758 m)   Wt 199 lb (90.3 kg)   SpO2 96%   BMI 29.22 kg/m    Subjective:    Patient ID: Carl Fernandez, male    DOB: [birth year], 75 y.o.   MRN: [id number]  HPI: Carl Fernandez is a 75 y.o. male  Chief Complaint  Patient presents with  . Annual Exam   Patient doing well all in all several different concerns dry mouth on CPAP especially in the morning sleeps with his mouth open reviewed chin straps other type devices Patient also with some intermittent knee discomfort has to hold on going up and down steps intermittently not all the time Will examine knees but reviewed knee strengthening exercises COPD doing well no complaints from breathers and breathing is doing okay Patient occasionally has had After Eating Is Taking Prilosec Every Day in the Afternoon Reviewed May Want to Take in the Morning Maybe Trial Maalox Mylanta  Relevant past medical, surgical, family and social history reviewed and updated as indicated. Interim medical history since our last visit reviewed. Allergies and medications reviewed and updated.  Review of Systems  Constitutional: Negative.   HENT: Negative.   Eyes: Negative.   Respiratory: Negative.   Cardiovascular: Negative.   Gastrointestinal: Negative.   Endocrine: Negative.   Genitourinary: Negative.   Musculoskeletal: Negative.   Skin: Negative.   Allergic/Immunologic: Negative.   Neurological: Negative.   Hematological: Negative.   Psychiatric/Behavioral: Negative.     Per HPI unless specifically indicated above     Objective:    BP 134/73 (BP Location: Right Arm, Cuff Size: Normal)   Pulse 77   Temp 97.8 F (36.6 C)   Ht 5' 9.2" (1.758 m)   Wt 199 lb (90.3 kg)   SpO2 96%   BMI 29.22 kg/m   Wt Readings from Last 3 Encounters:  01/26/16 199 lb (90.3 kg)  04/29/15 196 lb (88.9 kg)  10/29/14 192 lb (87.1 kg)    Physical Exam    Constitutional: He is oriented to person, place, and time. He appears well-developed and well-nourished.  HENT:  Head: Normocephalic and atraumatic.  Right Ear: External ear normal.  Left Ear: External ear normal.  Eyes: Conjunctivae and EOM are normal. Pupils are equal, round, and reactive to light.  Neck: Normal range of motion. Neck supple.  Cardiovascular: Normal rate, regular rhythm, normal heart sounds and intact distal pulses.   Pulmonary/Chest: Effort normal and breath sounds normal.  Abdominal: Soft. Bowel sounds are normal. There is no splenomegaly or hepatomegaly.  Genitourinary: Rectum normal and penis normal.  Genitourinary Comments: Prostate enlarged  Musculoskeletal: Normal range of motion.  Neurological: He is alert and oriented to person, place, and time. He has normal reflexes.  Skin: No rash noted. No erythema.  Psychiatric: He has a normal mood and affect. His behavior is normal. Judgment and thought content normal.    Results for orders placed or performed in visit on 10/29/14  Comprehensive metabolic panel  Result Value Ref Range   Glucose 83 65 - 99 mg/dL   BUN 11 8 - 27 mg/dL   Creatinine, Ser 1.11 0.76 - 1.27 mg/dL   GFR calc non Af Amer 65 >59 mL/min/1.73   GFR calc Af Amer 75 >59 mL/min/1.73   BUN/Creatinine Ratio 10 10 - 22   Sodium 142 134 - 144 mmol/L  Potassium 4.2 3.5 - 5.2 mmol/L   Chloride 101 97 - 108 mmol/L   CO2 26 18 - 29 mmol/L   Calcium 9.1 8.6 - 10.2 mg/dL   Total Protein 6.4 6.0 - 8.5 g/dL   Albumin 4.3 3.5 - 4.8 g/dL   Globulin, Total 2.1 1.5 - 4.5 g/dL   Albumin/Globulin Ratio 2.0 1.1 - 2.5   Bilirubin Total 0.4 0.0 - 1.2 mg/dL   Alkaline Phosphatase 71 39 - 117 IU/L   AST 16 0 - 40 IU/L   ALT 10 0 - 44 IU/L  CBC with Differential/Platelet  Result Value Ref Range   WBC 5.8 3.4 - 10.8 x10E3/uL   RBC 5.00 4.14 - 5.80 x10E6/uL   Hemoglobin 14.6 12.6 - 17.7 g/dL   Hematocrit 42.7 37.5 - 51.0 %   MCV 85 79 - 97 fL   MCH 29.2  26.6 - 33.0 pg   MCHC 34.2 31.5 - 35.7 g/dL   RDW 13.7 12.3 - 15.4 %   Platelets 199 150 - 379 x10E3/uL   Neutrophils 66 %   Lymphs 24 %   Monocytes 7 %   Eos 3 %   Basos 0 %   Neutrophils Absolute 3.9 1.4 - 7.0 x10E3/uL   Lymphocytes Absolute 1.4 0.7 - 3.1 x10E3/uL   Monocytes Absolute 0.4 0.1 - 0.9 x10E3/uL   EOS (ABSOLUTE) 0.2 0.0 - 0.4 x10E3/uL   Basophils Absolute 0.0 0.0 - 0.2 x10E3/uL   Immature Granulocytes 0 %   Immature Grans (Abs) 0.0 0.0 - 0.1 x10E3/uL  Urinalysis, Routine w reflex microscopic (not at [facility])  Result Value Ref Range   Specific Gravity, UA 1.015 1.005 - 1.030   pH, UA 7.0 5.0 - 7.5   Color, UA Yellow Yellow   Appearance Ur Clear Clear   Leukocytes, UA Negative Negative   Protein, UA Negative Negative/Trace   Glucose, UA Negative Negative   Ketones, UA Negative Negative   RBC, UA Negative Negative   Bilirubin, UA Negative Negative   Urobilinogen, Ur 0.2 0.2 - 1.0 mg/dL   Nitrite, UA Negative Negative      Assessment & Plan:   Problem List Items Addressed This Visit      Respiratory   COPD (chronic obstructive pulmonary disease) ([place])    The current medical regimen is effective;  continue present plan and medications.       Relevant Medications   loratadine (CLARITIN) 10 MG tablet   montelukast (SINGULAIR) 10 MG tablet   fluticasone (FLONASE) 50 MCG/ACT nasal spray   Fluticasone-Salmeterol (ADVAIR DISKUS) 250-50 MCG/DOSE AEPB   Other Relevant Orders   CBC with Differential/Platelet   TSH   OSA on CPAP    Discuss possible use of chin strap other type breathing devices dependent on pressure settings      Allergic rhinitis   Relevant Medications   fluticasone (FLONASE) 50 MCG/ACT nasal spray   Fluticasone-Salmeterol (ADVAIR DISKUS) 250-50 MCG/DOSE AEPB     Digestive   GERD (gastroesophageal reflux disease)    The current medical regimen is effective;  continue present plan and medications.       Relevant Medications   omeprazole  (PRILOSEC) 40 MG capsule    Other Visit Diagnoses    Well adult exam    -  Primary   Relevant Orders   CBC with Differential/Platelet   Comprehensive metabolic panel   Lipid Panel w/o Chol/HDL Ratio   PSA   TSH   Urinalysis, Routine w reflex microscopic (not  at [facility])   Immunization due       Relevant Orders   Flu vaccine HIGH DOSE PF (Fluzone High dose) (Completed)   Enlarged prostate       Relevant Orders   PSA   Other fatigue       Relevant Orders   TSH     Discuss leg strengthening exercises for knees  Follow up plan: Return if symptoms worsen or fail to improve, for Physical Exam.

## 2016-01-27 ENCOUNTER — Encounter: Payer: Self-pay | Admitting: Family Medicine

## 2016-01-27 DIAGNOSIS — H903 Sensorineural hearing loss, bilateral: Secondary | ICD-10-CM | POA: Diagnosis not present

## 2016-01-27 LAB — COMPREHENSIVE METABOLIC PANEL
A/G RATIO: 1.6 (ref 1.2–2.2)
ALBUMIN: 4.1 g/dL (ref 3.5–4.8)
ALK PHOS: 68 IU/L (ref 39–117)
ALT: 15 IU/L (ref 0–44)
AST: 16 IU/L (ref 0–40)
BILIRUBIN TOTAL: 0.7 mg/dL (ref 0.0–1.2)
BUN / CREAT RATIO: 10 (ref 10–24)
BUN: 12 mg/dL (ref 8–27)
CHLORIDE: 107 mmol/L — AB (ref 96–106)
CO2: 24 mmol/L (ref 18–29)
CREATININE: 1.17 mg/dL (ref 0.76–1.27)
Calcium: 8.8 mg/dL (ref 8.6–10.2)
GFR calc Af Amer: 70 mL/min/{1.73_m2} (ref >59)
GFR calc non Af Amer: 61 mL/min/{1.73_m2} (ref >59)
GLOBULIN, TOTAL: 2.6 g/dL (ref 1.5–4.5)
Glucose: 90 mg/dL (ref 65–99)
POTASSIUM: 4.5 mmol/L (ref 3.5–5.2)
SODIUM: 146 mmol/L — AB (ref 134–144)
Total Protein: 6.7 g/dL (ref 6.0–8.5)

## 2016-01-27 LAB — LIPID PANEL W/O CHOL/HDL RATIO
CHOLESTEROL TOTAL: 192 mg/dL (ref 100–199)
HDL: 41 mg/dL (ref >39)
LDL Calculated: 127 mg/dL — ABNORMAL HIGH (ref 0–99)
TRIGLYCERIDES: 118 mg/dL (ref 0–149)
VLDL Cholesterol Cal: 24 mg/dL (ref 5–40)

## 2016-01-27 LAB — CBC WITH DIFFERENTIAL/PLATELET
Basophils Absolute: 0 10*3/uL (ref 0.0–0.2)
Basos: 0 %
EOS (ABSOLUTE): 0.1 10*3/uL (ref 0.0–0.4)
EOS: 2 %
HEMATOCRIT: 42.3 % (ref 37.5–51.0)
Hemoglobin: 14.1 g/dL (ref 12.6–17.7)
Immature Grans (Abs): 0 10*3/uL (ref 0.0–0.1)
Immature Granulocytes: 0 %
LYMPHS ABS: 1.2 10*3/uL (ref 0.7–3.1)
Lymphs: 17 %
MCH: 28.3 pg (ref 26.6–33.0)
MCHC: 33.3 g/dL (ref 31.5–35.7)
MCV: 85 fL (ref 79–97)
MONOS ABS: 0.6 10*3/uL (ref 0.1–0.9)
Monocytes: 9 %
NEUTROS ABS: 5.1 10*3/uL (ref 1.4–7.0)
Neutrophils: 72 %
Platelets: 189 10*3/uL (ref 150–379)
RBC: 4.99 x10E6/uL (ref 4.14–5.80)
RDW: 13.4 % (ref 12.3–15.4)
WBC: 7.1 10*3/uL (ref 3.4–10.8)

## 2016-01-27 LAB — PSA: Prostate Specific Ag, Serum: 2.6 ng/mL (ref 0.0–4.0)

## 2016-01-27 LAB — TSH: TSH: 2.54 u[IU]/mL (ref 0.450–4.500)

## 2016-03-23 DIAGNOSIS — J4520 Mild intermittent asthma, uncomplicated: Secondary | ICD-10-CM | POA: Diagnosis not present

## 2016-03-23 DIAGNOSIS — G4733 Obstructive sleep apnea (adult) (pediatric): Secondary | ICD-10-CM | POA: Diagnosis not present

## 2016-04-22 ENCOUNTER — Other Ambulatory Visit: Payer: Self-pay | Admitting: Family Medicine

## 2016-05-23 ENCOUNTER — Encounter: Payer: Self-pay | Admitting: Family Medicine

## 2016-05-23 DIAGNOSIS — E7800 Pure hypercholesterolemia, unspecified: Secondary | ICD-10-CM | POA: Insufficient documentation

## 2016-07-18 DIAGNOSIS — Z08 Encounter for follow-up examination after completed treatment for malignant neoplasm: Secondary | ICD-10-CM | POA: Diagnosis not present

## 2016-07-18 DIAGNOSIS — D485 Neoplasm of uncertain behavior of skin: Secondary | ICD-10-CM | POA: Diagnosis not present

## 2016-07-18 DIAGNOSIS — L570 Actinic keratosis: Secondary | ICD-10-CM | POA: Diagnosis not present

## 2016-07-18 DIAGNOSIS — X32XXXA Exposure to sunlight, initial encounter: Secondary | ICD-10-CM | POA: Diagnosis not present

## 2016-07-18 DIAGNOSIS — Z85828 Personal history of other malignant neoplasm of skin: Secondary | ICD-10-CM | POA: Diagnosis not present

## 2016-07-18 DIAGNOSIS — L821 Other seborrheic keratosis: Secondary | ICD-10-CM | POA: Diagnosis not present

## 2016-07-18 DIAGNOSIS — B079 Viral wart, unspecified: Secondary | ICD-10-CM | POA: Diagnosis not present

## 2016-07-21 ENCOUNTER — Other Ambulatory Visit: Payer: Self-pay | Admitting: Family Medicine

## 2016-07-21 NOTE — Telephone Encounter (Signed)
  Last routine OV: 01/26/16 Next OV: 01/26/17

## 2016-08-26 DIAGNOSIS — H18832 Recurrent erosion of cornea, left eye: Secondary | ICD-10-CM | POA: Diagnosis not present

## 2016-08-29 DIAGNOSIS — H18832 Recurrent erosion of cornea, left eye: Secondary | ICD-10-CM | POA: Diagnosis not present

## 2016-09-01 DIAGNOSIS — M79672 Pain in left foot: Secondary | ICD-10-CM | POA: Diagnosis not present

## 2016-09-01 DIAGNOSIS — M79671 Pain in right foot: Secondary | ICD-10-CM | POA: Diagnosis not present

## 2016-09-21 DIAGNOSIS — J310 Chronic rhinitis: Secondary | ICD-10-CM | POA: Diagnosis not present

## 2016-09-21 DIAGNOSIS — R0602 Shortness of breath: Secondary | ICD-10-CM | POA: Diagnosis not present

## 2016-09-21 DIAGNOSIS — G4733 Obstructive sleep apnea (adult) (pediatric): Secondary | ICD-10-CM | POA: Diagnosis not present

## 2016-09-21 DIAGNOSIS — J4520 Mild intermittent asthma, uncomplicated: Secondary | ICD-10-CM | POA: Diagnosis not present

## 2016-09-29 ENCOUNTER — Ambulatory Visit (INDEPENDENT_AMBULATORY_CARE_PROVIDER_SITE_OTHER): Payer: Medicare Other | Admitting: Family Medicine

## 2016-09-29 ENCOUNTER — Encounter: Payer: Self-pay | Admitting: Family Medicine

## 2016-09-29 ENCOUNTER — Other Ambulatory Visit: Payer: Medicare Other

## 2016-09-29 VITALS — BP 166/74 | HR 83 | Temp 98.4°F | Wt 195.0 lb

## 2016-09-29 DIAGNOSIS — R399 Unspecified symptoms and signs involving the genitourinary system: Secondary | ICD-10-CM | POA: Diagnosis not present

## 2016-09-29 DIAGNOSIS — R300 Dysuria: Secondary | ICD-10-CM

## 2016-09-29 LAB — UA/M W/RFLX CULTURE, ROUTINE
Bilirubin, UA: NEGATIVE
GLUCOSE, UA: NEGATIVE
Leukocytes, UA: NEGATIVE
NITRITE UA: NEGATIVE
Protein, UA: NEGATIVE
Specific Gravity, UA: >1.03 — ABNORMAL HIGH (ref 1.005–1.030)
UUROB: 0.2 mg/dL (ref 0.2–1.0)
pH, UA: 5 (ref 5.0–7.5)

## 2016-09-29 LAB — MICROSCOPIC EXAMINATION

## 2016-09-29 NOTE — Progress Notes (Signed)
   BP (!) 166/74   Pulse 83   Temp 98.4 F (36.9 C)   Wt 195 lb (88.5 kg)   SpO2 95%   BMI 28.63 kg/m    Subjective:    Patient ID: Carl Fernandez, male    DOB: 1940/05/12, 76 y.o.   MRN: [id number]  HPI: Carl Fernandez is a 76 y.o. male  Chief Complaint  Patient presents with  . Dysuria    x 2-3 weeks, burning, some urgency. No increased frequency. No fever.    Patient presents with several weeks of mild intermittent dysuria and urgency. Denies frequency, hematuria, prostate tenderness, fever, chills, N/V, back pain. No hx of UTI or prostate infections. Has not taken anything for sxs.   Relevant past medical, surgical, family and social history reviewed and updated as indicated. Interim medical history since our last visit reviewed. Allergies and medications reviewed and updated.  Review of Systems  Constitutional: Negative.   HENT: Negative.   Respiratory: Negative.   Cardiovascular: Negative.   Gastrointestinal: Negative.   Genitourinary: Positive for dysuria and urgency.  Musculoskeletal: Negative.   Neurological: Negative.   Psychiatric/Behavioral: Negative.     Per HPI unless specifically indicated above     Objective:    BP (!) 166/74   Pulse 83   Temp 98.4 F (36.9 C)   Wt 195 lb (88.5 kg)   SpO2 95%   BMI 28.63 kg/m   Wt Readings from Last 3 Encounters:  09/29/16 195 lb (88.5 kg)  01/26/16 199 lb (90.3 kg)  04/29/15 196 lb (88.9 kg)    Physical Exam  Constitutional: He is oriented to person, place, and time. He appears well-developed and well-nourished.  HENT:  Head: Atraumatic.  Eyes: Conjunctivae are normal. Pupils are equal, round, and reactive to light.  Neck: Normal range of motion. Neck supple.  Cardiovascular: Normal rate and normal heart sounds.   Pulmonary/Chest: Effort normal. No respiratory distress.  Genitourinary: Prostate normal.  Genitourinary Comments: Prostate non-tender to palpation  Musculoskeletal: Normal range of motion.    Neurological: He is alert and oriented to person, place, and time.  Skin: Skin is warm and dry.  Psychiatric: He has a normal mood and affect. His behavior is normal.  Nursing note and vitals reviewed.     Assessment & Plan:   Problem List Items Addressed This Visit    None    Visit Diagnoses    Dysuria    -  Primary   U/A normal and prostate non-tender. Will continue to monitor closely. Probiotics, push fluids. F/u if worsening or no improvement   Relevant Orders   UA/M w/rflx Culture, Routine (STAT) (Completed)       Follow up plan: Return for as scheduled .

## 2016-09-30 DIAGNOSIS — H40003 Preglaucoma, unspecified, bilateral: Secondary | ICD-10-CM | POA: Diagnosis not present

## 2016-10-05 DIAGNOSIS — M79672 Pain in left foot: Secondary | ICD-10-CM | POA: Diagnosis not present

## 2016-10-05 DIAGNOSIS — M79671 Pain in right foot: Secondary | ICD-10-CM | POA: Diagnosis not present

## 2016-10-05 DIAGNOSIS — M659 Synovitis and tenosynovitis, unspecified: Secondary | ICD-10-CM | POA: Diagnosis not present

## 2016-10-12 ENCOUNTER — Ambulatory Visit: Payer: Medicare Other | Admitting: Family Medicine

## 2016-11-02 DIAGNOSIS — M659 Synovitis and tenosynovitis, unspecified: Secondary | ICD-10-CM | POA: Diagnosis not present

## 2016-12-12 ENCOUNTER — Telehealth: Payer: Self-pay | Admitting: Family Medicine

## 2016-12-12 NOTE — Telephone Encounter (Signed)
Called pt to schedule for Annual Wellness Visit with Nurse Health Advisor, Tiffany Hill, my c/b # is [phone]  Kathryn Brown ° °

## 2017-01-20 ENCOUNTER — Other Ambulatory Visit: Payer: Self-pay | Admitting: Family Medicine

## 2017-01-26 ENCOUNTER — Encounter: Payer: Medicare Other | Admitting: Family Medicine

## 2017-01-26 ENCOUNTER — Ambulatory Visit (INDEPENDENT_AMBULATORY_CARE_PROVIDER_SITE_OTHER): Payer: Medicare Other

## 2017-01-26 VITALS — BP 159/72 | HR 75 | Temp 97.6°F | Resp 16 | Ht 70.0 in | Wt 195.7 lb

## 2017-01-26 DIAGNOSIS — Z23 Encounter for immunization: Secondary | ICD-10-CM

## 2017-01-26 DIAGNOSIS — I10 Essential (primary) hypertension: Secondary | ICD-10-CM

## 2017-01-26 DIAGNOSIS — E7800 Pure hypercholesterolemia, unspecified: Secondary | ICD-10-CM | POA: Diagnosis not present

## 2017-01-26 DIAGNOSIS — Z0000 Encounter for general adult medical examination without abnormal findings: Secondary | ICD-10-CM | POA: Diagnosis not present

## 2017-01-26 DIAGNOSIS — Z1329 Encounter for screening for other suspected endocrine disorder: Secondary | ICD-10-CM | POA: Diagnosis not present

## 2017-01-26 DIAGNOSIS — Z125 Encounter for screening for malignant neoplasm of prostate: Secondary | ICD-10-CM

## 2017-01-26 DIAGNOSIS — Z131 Encounter for screening for diabetes mellitus: Secondary | ICD-10-CM | POA: Diagnosis not present

## 2017-01-26 DIAGNOSIS — R351 Nocturia: Secondary | ICD-10-CM

## 2017-01-26 DIAGNOSIS — Z13220 Encounter for screening for lipoid disorders: Secondary | ICD-10-CM | POA: Diagnosis not present

## 2017-01-26 NOTE — Progress Notes (Signed)
Subjective:   Carl Fernandez is a 76 y.o. male who presents for Medicare Annual/Subsequent preventive examination.  Review of Systems:  Cardiac Risk Factors include: male gender;advanced age (>27men, >65 women);hypertension;dyslipidemia     Objective:    Vitals: BP (!) 159/72 (BP Location: Left Arm, Patient Position: Sitting)   Pulse 75   Temp 97.6 F (36.4 C)   Resp 16   Ht 5\' 10"  (1.778 m)   Wt 195 lb 11.2 oz (88.8 kg)   BMI 28.08 kg/m   Body mass index is 28.08 kg/m.  Tobacco History  Smoking Status  . Never Smoker  Smokeless Tobacco  . Never Used     Counseling given: Not Answered   Past Medical History:  Diagnosis Date  . COPD (chronic obstructive pulmonary disease) ([place])   . Diverticulitis   . GERD (gastroesophageal reflux disease)   . Hard of hearing   . Kidney stones   . OSA on CPAP   . Plantar fasciitis    Past Surgical History:  Procedure Laterality Date  . LITHOTRIPSY    . nasoplasty     Family History  Problem Relation Age of Onset  . Hypertension Mother   . Osteoporosis Mother   . Cancer Mother        skin on her nose  . Heart disease Father    History  Sexual Activity  . Sexual activity: Not on file    Outpatient Encounter Prescriptions as of 01/26/2017  Medication Sig  . albuterol (PROAIR HFA) 108 (90 BASE) MCG/ACT inhaler Inhale into the lungs.  . DENTA 5000 PLUS 1.1 % CREA dental cream   . fluticasone (FLONASE) 50 MCG/ACT nasal spray Place 2 sprays into both nostrils daily.  . Fluticasone-Salmeterol (ADVAIR DISKUS) 250-50 MCG/DOSE AEPB Inhale 1 puff into the lungs 2 (two) times daily.  . montelukast (SINGULAIR) 10 MG tablet Take 1 tablet (10 mg total) by mouth at bedtime.  . nepafenac (ILEVRO) 0.3 % ophthalmic suspension 1 drop daily.  Marland Kitchen omeprazole (PRILOSEC) 40 MG capsule Take 1 capsule (40 mg total) by mouth daily.  . [DISCONTINUED] loratadine (CLARITIN) 10 MG tablet Take by mouth.  . [DISCONTINUED] moxifloxacin (VIGAMOX) 0.5 %  ophthalmic solution 1 drop 3 (three) times daily.  . [DISCONTINUED] sodium chloride (MURO 128) 5 % ophthalmic solution 1 drop as needed for irritation.   No facility-administered encounter medications on file as of 01/26/2017.     Activities of Daily Living In your present state of health, do you have any difficulty performing the following activities: 01/26/2017  Hearing? Y  Comment wears hearing aids   Vision? N  Difficulty concentrating or making decisions? N  Walking or climbing stairs? N  Dressing or bathing? N  Doing errands, shopping? N  Preparing Food and eating ? N  Using the Toilet? N  In the past six months, have you accidently leaked urine? N  Do you have problems with loss of bowel control? N  Managing your Medications? N  Managing your Finances? N  Housekeeping or managing your Housekeeping? N  Some recent data might be hidden    Patient Care Team: Guadalupe Maple, MD as PCP - General (Family Medicine) Erby Pian, MD as Referring Physician (Specialist) Dasher, Rayvon Char, MD (Dermatology) Manya Silvas, MD (Gastroenterology)   Assessment:     Exercise Activities and Dietary recommendations Current Exercise Habits: The patient does not participate in regular exercise at present, Exercise limited by: None identified  Goals  None     Fall Risk Fall Risk  01/26/2017 05/01/2015 04/29/2015  Falls in the past year? No No Yes  Number falls in past yr: - - 1  Injury with Fall? - - No   Depression Screen PHQ 2/9 Scores 01/26/2017 04/29/2015  PHQ - 2 Score 0 0    Cognitive Function     6CIT Screen 01/26/2017  What Year? 0 points  What month? 0 points  What time? 0 points  Count back from 20 0 points  Months in reverse 0 points  Repeat phrase 0 points  Total Score 0    Immunization History  Administered Date(s) Administered  . Influenza, High Dose Seasonal PF 01/26/2016  . Influenza,inj,Quad PF,6+ Mos 04/29/2015  . Influenza-Unspecified  01/07/2014  . Pneumococcal Conjugate-13 04/29/2015  . Tdap 10/09/2013   Screening Tests Health Maintenance  Topic Date Due  . TETANUS/TDAP  10/10/2023  . INFLUENZA VACCINE  Completed  . PNA vac Low Risk Adult  Completed      Plan:  I have personally reviewed and addressed the Medicare Annual Wellness questionnaire and have noted the following in the patient's chart:  A. Medical and social history B. Use of alcohol, tobacco or illicit drugs  C. Current medications and supplements D. Functional ability and status E.  Nutritional status F.  Physical activity G. Advance directives H. List of other physicians I.  Hospitalizations, surgeries, and ER visits in previous 12 months J.  [employer] such as hearing and vision if needed, cognitive and depression L. Referrals and appointments   In addition, I have reviewed and discussed with patient certain preventive protocols, quality metrics, and best practice recommendations. A written personalized care plan for preventive services as well as general preventive health recommendations were provided to patient.   Signed,  Tyler Aas, LPN Nurse Health Advisor   MD Recommendations: none

## 2017-01-26 NOTE — Patient Instructions (Addendum)
[name] , Thank you for taking time to come for your Medicare Wellness Visit. I appreciate your ongoing commitment to your health goals. Please review the following plan we discussed and let me know if I can assist you in the future.   Screening recommendations/referrals: Colonoscopy: completed 11/18/2011 Recommended yearly ophthalmology/optometry visit for glaucoma screening and checkup Recommended yearly dental visit for hygiene and checkup  Vaccinations: Influenza vaccine: done today Pneumococcal vaccine: pneumovax 23 done today Tdap vaccine: up to date Shingles vaccine: due, check with your insurance company for coverage  Advanced directives: Please bring a copy of your health care power of attorney and living will to the office at your convenience.  Conditions/risks identified: none   Next appointment: Follow up on 01/31/2017 at 11:00am with Dr.Crissman. Follow up in one year for your annual wellness exam.   Preventive Care 65 Years and Older, Male Preventive care refers to lifestyle choices and visits with your health care provider that can promote health and wellness. What does preventive care include?  A yearly physical exam. This is also called an annual well check.  Dental exams once or twice a year.  Routine eye exams. Ask your health care provider how often you should have your eyes checked.  Personal lifestyle choices, including:  Daily care of your teeth and gums.  Regular physical activity.  Eating a healthy diet.  Avoiding tobacco and drug use.  Limiting alcohol use.  Practicing safe sex.  Taking low doses of aspirin every day.  Taking vitamin and mineral supplements as recommended by your health care provider. What happens during an annual well check? The services and screenings done by your health care provider during your annual well check will depend on your age, overall health, lifestyle risk factors, and family history of disease. Counseling    Your health care provider may ask you questions about your:  Alcohol use.  Tobacco use.  Drug use.  Emotional well-being.  Home and relationship well-being.  Sexual activity.  Eating habits.  History of falls.  Memory and ability to understand (cognition).  Work and work Statistician. Screening  You may have the following tests or measurements:  Height, weight, and BMI.  Blood pressure.  Lipid and cholesterol levels. These may be checked every 5 years, or more frequently if you are over 69 years old.  Skin check.  Lung cancer screening. You may have this screening every year starting at age 71 if you have a 30-pack-year history of smoking and currently smoke or have quit within the past 15 years.  Fecal occult blood test (FOBT) of the stool. You may have this test every year starting at age 64.  Flexible sigmoidoscopy or colonoscopy. You may have a sigmoidoscopy every 5 years or a colonoscopy every 10 years starting at age 72.  Prostate cancer screening. Recommendations will vary depending on your family history and other risks.  Hepatitis C blood test.  Hepatitis B blood test.  Sexually transmitted disease (STD) testing.  Diabetes screening. This is done by checking your blood sugar (glucose) after you have not eaten for a while (fasting). You may have this done every 1-3 years.  Abdominal aortic aneurysm (AAA) screening. You may need this if you are a current or former smoker.  Osteoporosis. You may be screened starting at age 53 if you are at high risk. Talk with your health care provider about your test results, treatment options, and if necessary, the need for more tests. Vaccines  Your health care  provider may recommend certain vaccines, such as:  Influenza vaccine. This is recommended every year.  Tetanus, diphtheria, and acellular pertussis (Tdap, Td) vaccine. You may need a Td booster every 10 years.  Zoster vaccine. You may need this after age  8.  Pneumococcal 13-valent conjugate (PCV13) vaccine. One dose is recommended after age 63.  Pneumococcal polysaccharide (PPSV23) vaccine. One dose is recommended after age 31. Talk to your health care provider about which screenings and vaccines you need and how often you need them. This information is not intended to replace advice given to you by your health care provider. Make sure you discuss any questions you have with your health care provider. Document Released: 05/22/2015 Document Revised: 01/13/2016 Document Reviewed: 02/24/2015 Elsevier Interactive Patient Education  2017 [place] Prevention in the Home Falls can cause injuries. They can happen to people of all ages. There are many things you can do to make your home safe and to help prevent falls. What can I do on the outside of my home?  Regularly fix the edges of walkways and driveways and fix any cracks.  Remove anything that might make you trip as you walk through a door, such as a raised step or threshold.  Trim any bushes or trees on the path to your home.  Use bright outdoor lighting.  Clear any walking paths of anything that might make someone trip, such as rocks or tools.  Regularly check to see if handrails are loose or broken. Make sure that both sides of any steps have handrails.  Any raised decks and porches should have guardrails on the edges.  Have any leaves, snow, or ice cleared regularly.  Use sand or salt on walking paths during winter.  Clean up any spills in your garage right away. This includes oil or grease spills. What can I do in the bathroom?  Use night lights.  Install grab bars by the toilet and in the tub and shower. Do not use towel bars as grab bars.  Use non-skid mats or decals in the tub or shower.  If you need to sit down in the shower, use a plastic, non-slip stool.  Keep the floor dry. Clean up any water that spills on the floor as soon as it happens.  Remove  soap buildup in the tub or shower regularly.  Attach bath mats securely with double-sided non-slip rug tape.  Do not have throw rugs and other things on the floor that can make you trip. What can I do in the bedroom?  Use night lights.  Make sure that you have a light by your bed that is easy to reach.  Do not use any sheets or blankets that are too big for your bed. They should not hang down onto the floor.  Have a firm chair that has side arms. You can use this for support while you get dressed.  Do not have throw rugs and other things on the floor that can make you trip. What can I do in the kitchen?  Clean up any spills right away.  Avoid walking on wet floors.  Keep items that you use a lot in easy-to-reach places.  If you need to reach something above you, use a strong step stool that has a grab bar.  Keep electrical cords out of the way.  Do not use floor polish or wax that makes floors slippery. If you must use wax, use non-skid floor wax.  Do not have throw  rugs and other things on the floor that can make you trip. What can I do with my stairs?  Do not leave any items on the stairs.  Make sure that there are handrails on both sides of the stairs and use them. Fix handrails that are broken or loose. Make sure that handrails are as long as the stairways.  Check any carpeting to make sure that it is firmly attached to the stairs. Fix any carpet that is loose or worn.  Avoid having throw rugs at the top or bottom of the stairs. If you do have throw rugs, attach them to the floor with carpet tape.  Make sure that you have a light switch at the top of the stairs and the bottom of the stairs. If you do not have them, ask someone to add them for you. What else can I do to help prevent falls?  Wear shoes that:  Do not have high heels.  Have rubber bottoms.  Are comfortable and fit you well.  Are closed at the toe. Do not wear sandals.  If you use a  stepladder:  Make sure that it is fully opened. Do not climb a closed stepladder.  Make sure that both sides of the stepladder are locked into place.  Ask someone to hold it for you, if possible.  Clearly mark and make sure that you can see:  Any grab bars or handrails.  First and last steps.  Where the edge of each step is.  Use tools that help you move around (mobility aids) if they are needed. These include:  Canes.  Walkers.  Scooters.  Crutches.  Turn on the lights when you go into a dark area. Replace any light bulbs as soon as they burn out.  Set up your furniture so you have a clear path. Avoid moving your furniture around.  If any of your floors are uneven, fix them.  If there are any pets around you, be aware of where they are.  Review your medicines with your doctor. Some medicines can make you feel dizzy. This can increase your chance of falling. Ask your doctor what other things that you can do to help prevent falls. This information is not intended to replace advice given to you by your health care provider. Make sure you discuss any questions you have with your health care provider. Document Released: 02/19/2009 Document Revised: 10/01/2015 Document Reviewed: 05/30/2014   Pneumococcal Polysaccharide Vaccine: What You Need to Know 1. Why get vaccinated? Vaccination can protect older adults (and some children and younger adults) from pneumococcal disease. Pneumococcal disease is caused by bacteria that can spread from person to person through close contact. It can cause ear infections, and it can also lead to more serious infections of the:  Lungs (pneumonia),  Blood (bacteremia), and  Covering of the brain and spinal cord (meningitis). Meningitis can cause deafness and brain damage, and it can be fatal.  Anyone can get pneumococcal disease, but children under 27 years of age, people with certain medical conditions, adults over 90 years of age, and  cigarette smokers are at the highest risk. About 18,000 older adults die each year from pneumococcal disease in the [place]. Treatment of pneumococcal infections with penicillin and other drugs used to be more effective. But some strains of the disease have become resistant to these drugs. This makes prevention of the disease, through vaccination, even more important. 2. Pneumococcal polysaccharide vaccine (PPSV23) Pneumococcal polysaccharide vaccine (PPSV23) protects against  23 types of pneumococcal bacteria. It will not prevent all pneumococcal disease. PPSV23 is recommended for:  All adults 35 years of age and older,  Anyone 2 through 76 years of age with certain long-term health problems,  Anyone 2 through 76 years of age with a weakened immune system,  Adults 11 through 76 years of age who smoke cigarettes or have asthma.  Most people need only one dose of PPSV. A second dose is recommended for certain high-risk groups. People 30 and older should get a dose even if they have gotten one or more doses of the vaccine before they turned 65. Your healthcare provider can give you more information about these recommendations. Most healthy adults develop protection within 2 to 3 weeks of getting the shot. 3. Some people should not get this vaccine  Anyone who has had a life-threatening allergic reaction to PPSV should not get another dose.  Anyone who has a severe allergy to any component of PPSV should not receive it. Tell your provider if you have any severe allergies.  Anyone who is moderately or severely ill when the shot is scheduled may be asked to wait until they recover before getting the vaccine. Someone with a mild illness can usually be vaccinated.  Children less than 43 years of age should not receive this vaccine.  There is no evidence that PPSV is harmful to either a pregnant woman or to her fetus. However, as a precaution, women who need the vaccine should be vaccinated  before becoming pregnant, if possible. 4. Risks of a vaccine reaction With any medicine, including vaccines, there is a chance of side effects. These are usually mild and go away on their own, but serious reactions are also possible. About half of people who get PPSV have mild side effects, such as redness or pain where the shot is given, which go away within about two days. Less than 1 out of 100 people develop a fever, muscle aches, or more severe local reactions. Problems that could happen after any vaccine:  People sometimes faint after a medical procedure, including vaccination. Sitting or lying down for about 15 minutes can help prevent fainting, and injuries caused by a fall. Tell your doctor if you feel dizzy, or have vision changes or ringing in the ears.  Some people get severe pain in the shoulder and have difficulty moving the arm where a shot was given. This happens very rarely.  Any medication can cause a severe allergic reaction. Such reactions from a vaccine are very rare, estimated at about 1 in a million doses, and would happen within a few minutes to a few hours after the vaccination. As with any medicine, there is a very remote chance of a vaccine causing a serious injury or death. The safety of vaccines is always being monitored. For more information, visit: http://www.aguilar.org/ 5. What if there is a serious reaction? What should I look for? Look for anything that concerns you, such as signs of a severe allergic reaction, very high fever, or unusual behavior. Signs of a severe allergic reaction can include hives, swelling of the face and throat, difficulty breathing, a fast heartbeat, dizziness, and weakness. These would usually start a few minutes to a few hours after the vaccination. What should I do? If you think it is a severe allergic reaction or other emergency that can't wait, call 9-1-1 or get to the nearest hospital. Otherwise, call your doctor. Afterward, the  reaction should be reported to the Vaccine  Adverse Event Reporting System (VAERS). Your doctor might file this report, or you can do it yourself through the VAERS web site at www.vaers.SamedayNews.es, or by calling (431) 076-7002. VAERS does not give medical advice. 6. How can I learn more?  Ask your doctor. He or she can give you the vaccine package insert or suggest other sources of information.  Call your local or state health department.  Contact the Centers for Disease Control and Prevention (CDC): ? Call (210)812-6648 (1-800-CDC-INFO) or ? Visit CDC's website at http://hunter.com/ CDC Pneumococcal Polysaccharide Vaccine VIS (08/30/13) This information is not intended to replace advice given to you by your health care provider. Make sure you discuss any questions you have with your health care provider. Document Released: 02/20/2006 Document Revised: 01/14/2016 Document Reviewed: 01/14/2016 Elsevier Interactive Patient Education  2017 [place] Interactive Patient Education  2017 [employer].  Influenza (Flu) Vaccine (Inactivated or Recombinant): What You Need to Know 1. Why get vaccinated? Influenza ("flu") is a contagious disease that spreads around the [place] every year, usually between October and May. Flu is caused by influenza viruses, and is spread mainly by coughing, sneezing, and close contact. Anyone can get flu. Flu strikes suddenly and can last several days. Symptoms vary by age, but can include:  fever/chills  sore throat  muscle aches  fatigue  cough  headache  runny or stuffy nose  Flu can also lead to pneumonia and blood infections, and cause diarrhea and seizures in children. If you have a medical condition, such as heart or lung disease, flu can make it worse. Flu is more dangerous for some people. Infants and young children, people 88 years of age and older, pregnant women, and people with certain health conditions or a weakened immune  system are at greatest risk. Each year thousands of people in the [place] die from flu, and many more are hospitalized. Flu vaccine can:  keep you from getting flu,  make flu less severe if you do get it, and  keep you from spreading flu to your family and other people. 2. Inactivated and recombinant flu vaccines A dose of flu vaccine is recommended every flu season. Children 6 months through 26 years of age may need two doses during the same flu season. Everyone else needs only one dose each flu season. Some inactivated flu vaccines contain a very small amount of a mercury-based preservative called thimerosal. Studies have not shown thimerosal in vaccines to be harmful, but flu vaccines that do not contain thimerosal are available. There is no live flu virus in flu shots. They cannot cause the flu. There are many flu viruses, and they are always changing. Each year a new flu vaccine is made to protect against three or four viruses that are likely to cause disease in the upcoming flu season. But even when the vaccine doesn't exactly match these viruses, it may still provide some protection. Flu vaccine cannot prevent:  flu that is caused by a virus not covered by the vaccine, or  illnesses that look like flu but are not.  It takes about 2 weeks for protection to develop after vaccination, and protection lasts through the flu season. 3. Some people should not get this vaccine Tell the person who is giving you the vaccine:  If you have any severe, life-threatening allergies. If you ever had a life-threatening allergic reaction after a dose of flu vaccine, or have a severe allergy to any part of this vaccine, you may be  advised not to get vaccinated. Most, but not all, types of flu vaccine contain a small amount of egg protein.  If you ever had Guillain-Barr Syndrome (also called GBS). Some people with a history of GBS should not get this vaccine. This should be discussed with your  doctor.  If you are not feeling well. It is usually okay to get flu vaccine when you have a mild illness, but you might be asked to come back when you feel better.  4. Risks of a vaccine reaction With any medicine, including vaccines, there is a chance of reactions. These are usually mild and go away on their own, but serious reactions are also possible. Most people who get a flu shot do not have any problems with it. Minor problems following a flu shot include:  soreness, redness, or swelling where the shot was given  hoarseness  sore, red or itchy eyes  cough  fever  aches  headache  itching  fatigue  If these problems occur, they usually begin soon after the shot and last 1 or 2 days. More serious problems following a flu shot can include the following:  There may be a small increased risk of Guillain-Barre Syndrome (GBS) after inactivated flu vaccine. This risk has been estimated at 1 or 2 additional cases per million people vaccinated. This is much lower than the risk of severe complications from flu, which can be prevented by flu vaccine.  Young children who get the flu shot along with pneumococcal vaccine (PCV13) and/or DTaP vaccine at the same time might be slightly more likely to have a seizure caused by fever. Ask your doctor for more information. Tell your doctor if a child who is getting flu vaccine has ever had a seizure.  Problems that could happen after any injected vaccine:  People sometimes faint after a medical procedure, including vaccination. Sitting or lying down for about 15 minutes can help prevent fainting, and injuries caused by a fall. Tell your doctor if you feel dizzy, or have vision changes or ringing in the ears.  Some people get severe pain in the shoulder and have difficulty moving the arm where a shot was given. This happens very rarely.  Any medication can cause a severe allergic reaction. Such reactions from a vaccine are very rare, estimated  at about 1 in a million doses, and would happen within a few minutes to a few hours after the vaccination. As with any medicine, there is a very remote chance of a vaccine causing a serious injury or death. The safety of vaccines is always being monitored. For more information, visit: http://www.aguilar.org/ 5. What if there is a serious reaction? What should I look for? Look for anything that concerns you, such as signs of a severe allergic reaction, very high fever, or unusual behavior. Signs of a severe allergic reaction can include hives, swelling of the face and throat, difficulty breathing, a fast heartbeat, dizziness, and weakness. These would start a few minutes to a few hours after the vaccination. What should I do?  If you think it is a severe allergic reaction or other emergency that can't wait, call 9-1-1 and get the person to the nearest hospital. Otherwise, call your doctor.  Reactions should be reported to the Vaccine Adverse Event Reporting System (VAERS). Your doctor should file this report, or you can do it yourself through the VAERS web site at www.vaers.SamedayNews.es, or by calling 7828344119. ? VAERS does not give medical advice. 6. The [employer]  Injury Compensation Program The National Vaccine Injury Compensation Program (VICP) is a federal program that was created to compensate people who may have been injured by certain vaccines. Persons who believe they may have been injured by a vaccine can learn about the program and about filing a claim by calling 913-220-4289 or visiting the [place] website at GoldCloset.com.ee. There is a time limit to file a claim for compensation. 7. How can I learn more?  Ask your healthcare provider. He or she can give you the vaccine package insert or suggest other sources of information.  Call your local or state health department.  Contact the Centers for Disease Control and Prevention (CDC): ? Call 807-560-5484  (1-800-CDC-INFO) or ? Visit CDC's website at https://gibson.com/ Vaccine Information Statement, Inactivated Influenza Vaccine (12/13/2013) This information is not intended to replace advice given to you by your health care provider. Make sure you discuss any questions you have with your health care provider. Document Released: 02/17/2006 Document Revised: 01/14/2016 Document Reviewed: 01/14/2016 Elsevier Interactive Patient Education  2017 [employer].

## 2017-01-27 ENCOUNTER — Other Ambulatory Visit: Payer: Medicare Other

## 2017-01-27 DIAGNOSIS — Z125 Encounter for screening for malignant neoplasm of prostate: Secondary | ICD-10-CM | POA: Diagnosis not present

## 2017-01-27 DIAGNOSIS — R351 Nocturia: Secondary | ICD-10-CM

## 2017-01-27 DIAGNOSIS — E7800 Pure hypercholesterolemia, unspecified: Secondary | ICD-10-CM

## 2017-01-28 LAB — LIPID PANEL
CHOLESTEROL TOTAL: 172 mg/dL (ref 100–199)
Chol/HDL Ratio: 4.8 ratio (ref 0.0–5.0)
HDL: 36 mg/dL — AB (ref >39)
LDL Calculated: 116 mg/dL — ABNORMAL HIGH (ref 0–99)
TRIGLYCERIDES: 100 mg/dL (ref 0–149)
VLDL CHOLESTEROL CAL: 20 mg/dL (ref 5–40)

## 2017-01-28 LAB — PSA: Prostate Specific Ag, Serum: 2.9 ng/mL (ref 0.0–4.0)

## 2017-01-31 ENCOUNTER — Ambulatory Visit (INDEPENDENT_AMBULATORY_CARE_PROVIDER_SITE_OTHER): Payer: Medicare Other | Admitting: Family Medicine

## 2017-01-31 ENCOUNTER — Encounter: Payer: Self-pay | Admitting: Family Medicine

## 2017-01-31 VITALS — BP 148/70 | HR 86 | Wt 196.0 lb

## 2017-01-31 DIAGNOSIS — Z9989 Dependence on other enabling machines and devices: Secondary | ICD-10-CM

## 2017-01-31 DIAGNOSIS — E7800 Pure hypercholesterolemia, unspecified: Secondary | ICD-10-CM | POA: Diagnosis not present

## 2017-01-31 DIAGNOSIS — Z7189 Other specified counseling: Secondary | ICD-10-CM

## 2017-01-31 DIAGNOSIS — G4733 Obstructive sleep apnea (adult) (pediatric): Secondary | ICD-10-CM | POA: Diagnosis not present

## 2017-01-31 DIAGNOSIS — K219 Gastro-esophageal reflux disease without esophagitis: Secondary | ICD-10-CM | POA: Diagnosis not present

## 2017-01-31 DIAGNOSIS — J42 Unspecified chronic bronchitis: Secondary | ICD-10-CM

## 2017-01-31 DIAGNOSIS — Z0000 Encounter for general adult medical examination without abnormal findings: Secondary | ICD-10-CM

## 2017-01-31 DIAGNOSIS — I10 Essential (primary) hypertension: Secondary | ICD-10-CM | POA: Diagnosis not present

## 2017-01-31 LAB — URINALYSIS, ROUTINE W REFLEX MICROSCOPIC
Bilirubin, UA: NEGATIVE
Glucose, UA: NEGATIVE
Ketones, UA: NEGATIVE
Leukocytes, UA: NEGATIVE
NITRITE UA: NEGATIVE
PH UA: 7 (ref 5.0–7.5)
Protein, UA: NEGATIVE
RBC, UA: NEGATIVE
Specific Gravity, UA: 1.015 (ref 1.005–1.030)
UUROB: 0.2 mg/dL (ref 0.2–1.0)

## 2017-01-31 LAB — MICROSCOPIC EXAMINATION: BACTERIA UA: NONE SEEN

## 2017-01-31 MED ORDER — ALBUTEROL SULFATE HFA 108 (90 BASE) MCG/ACT IN AERS: 2.0000 | INHALATION_SPRAY | Freq: Four times a day (QID) | RESPIRATORY_TRACT | 1 refills | Status: DC | PRN

## 2017-01-31 MED ORDER — BENAZEPRIL HCL 40 MG PO TABS: 40.0000 mg | ORAL_TABLET | Freq: Every day | ORAL | 1 refills | Status: DC

## 2017-01-31 MED ORDER — OMEPRAZOLE 40 MG PO CPDR: 40.0000 mg | DELAYED_RELEASE_CAPSULE | Freq: Every day | ORAL | 4 refills | Status: DC

## 2017-01-31 MED ORDER — MONTELUKAST SODIUM 10 MG PO TABS: 10.0000 mg | ORAL_TABLET | Freq: Every day | ORAL | 4 refills | Status: DC

## 2017-01-31 MED ORDER — FLUTICASONE-SALMETEROL 250-50 MCG/DOSE IN AEPB: 1.0000 | INHALATION_SPRAY | Freq: Two times a day (BID) | RESPIRATORY_TRACT | 4 refills | Status: DC

## 2017-01-31 NOTE — Assessment & Plan Note (Signed)
The current medical regimen is effective;  continue present plan and medications.  

## 2017-01-31 NOTE — Progress Notes (Signed)
BP (!) 148/70 (BP Location: Left Arm)   Pulse 86   Wt 196 lb (88.9 kg)   SpO2 96%   BMI 28.12 kg/m    Subjective:    Patient ID: Carl Fernandez, male    DOB: 1940/05/30, 76 y.o.   MRN: [id number]  HPI: Carl Fernandez is a 76 y.o. male  Annual exam and med check Discussed with patient elevated blood pressure readings on chart review patient's had elevated blood pressure readings this year Not taking any medications. Patient also with some burning feet from time to time.  seems to be doing okay with Advair with rare use of albuterol  Relevant past medical, surgical, family and social history reviewed and updated as indicated. Interim medical history since our last visit reviewed. Allergies and medications reviewed and updated.  Review of Systems  Constitutional: Negative.   HENT: Negative.   Eyes: Negative.   Respiratory: Negative.   Cardiovascular: Negative.   Gastrointestinal: Negative.   Endocrine: Negative.   Genitourinary: Negative.   Musculoskeletal: Negative.   Skin: Negative.   Allergic/Immunologic: Negative.   Neurological: Negative.   Hematological: Negative.   Psychiatric/Behavioral: Negative.     Per HPI unless specifically indicated above     Objective:    BP (!) 148/70 (BP Location: Left Arm)   Pulse 86   Wt 196 lb (88.9 kg)   SpO2 96%   BMI 28.12 kg/m   Wt Readings from Last 3 Encounters:  01/31/17 196 lb (88.9 kg)  01/26/17 195 lb 11.2 oz (88.8 kg)  09/29/16 195 lb (88.5 kg)    Physical Exam  Constitutional: He is oriented to person, place, and time. He appears well-developed and well-nourished.  HENT:  Head: Normocephalic and atraumatic.  Right Ear: External ear normal.  Left Ear: External ear normal.  Eyes: Pupils are equal, round, and reactive to light. Conjunctivae and EOM are normal.  Neck: Normal range of motion. Neck supple.  Cardiovascular: Normal rate, regular rhythm, normal heart sounds and intact distal pulses.     Pulmonary/Chest: Effort normal and breath sounds normal.  Abdominal: Soft. Bowel sounds are normal. There is no splenomegaly or hepatomegaly.  Genitourinary: Rectum normal, prostate normal and penis normal.  Musculoskeletal: Normal range of motion.  Neurological: He is alert and oriented to person, place, and time. He has normal reflexes.  Skin: No rash noted. No erythema.  Psychiatric: He has a normal mood and affect. His behavior is normal. Judgment and thought content normal.    Results for orders placed or performed in visit on 01/27/17  Lipid panel  Result Value Ref Range   Cholesterol, Total 172 100 - 199 mg/dL   Triglycerides 100 0 - 149 mg/dL   HDL 36 (L) >39 mg/dL   VLDL Cholesterol Cal 20 5 - 40 mg/dL   LDL Calculated 116 (H) 0 - 99 mg/dL   Chol/HDL Ratio 4.8 0.0 - 5.0 ratio  PSA  Result Value Ref Range   Prostate Specific Ag, Serum 2.9 0.0 - 4.0 ng/mL      Assessment & Plan:   Problem List Items Addressed This Visit      Cardiovascular and Mediastinum   Essential hypertension    Patient discussed hypertension and elevated blood pressures will start Benzapril 40 mg 1 a day we will recheck in 1 month to assess blood pressure control.      Relevant Medications   benazepril (LOTENSIN) 40 MG tablet   Other Relevant Orders   CBC with Differential/Platelet  Comprehensive metabolic panel   TSH   Urinalysis, Routine w reflex microscopic   EKG 12-Lead (Completed)     Respiratory   COPD (chronic obstructive pulmonary disease) (HCC)    The current medical regimen is effective;  continue present plan and medications.       Relevant Medications   Fluticasone-Salmeterol (ADVAIR DISKUS) 250-50 MCG/DOSE AEPB   montelukast (SINGULAIR) 10 MG tablet   albuterol (PROAIR HFA) 108 (90 Base) MCG/ACT inhaler   Other Relevant Orders   CBC with Differential/Platelet   Comprehensive metabolic panel   TSH   Urinalysis, Routine w reflex microscopic   OSA on CPAP    The  current medical regimen is effective;  continue present plan and medications.       Relevant Orders   CBC with Differential/Platelet   Comprehensive metabolic panel   TSH   Urinalysis, Routine w reflex microscopic     Digestive   GERD (gastroesophageal reflux disease)    The current medical regimen is effective;  continue present plan and medications.       Relevant Medications   omeprazole (PRILOSEC) 40 MG capsule   Other Relevant Orders   CBC with Differential/Platelet   Comprehensive metabolic panel   TSH   Urinalysis, Routine w reflex microscopic     Other   Hypercholesteremia - Primary   Relevant Medications   benazepril (LOTENSIN) 40 MG tablet   Other Relevant Orders   CBC with Differential/Platelet   Comprehensive metabolic panel   TSH   Urinalysis, Routine w reflex microscopic   Advanced care planning/counseling discussion    A voluntary discussion about advance care planning including the explanation and discussion of advance directives was extensively discussed  with the patient.  Explanation about the health care proxy and Living will was reviewed and packet with forms with explanation of how to fill them out was given.          Other Visit Diagnoses    PE (physical exam), annual           Follow up plan: Return in about 4 weeks (around 02/28/2017) for BMP.

## 2017-01-31 NOTE — Assessment & Plan Note (Signed)
Patient discussed hypertension and elevated blood pressures will start Benzapril 40 mg 1 a day we will recheck in 1 month to assess blood pressure control.

## 2017-01-31 NOTE — Assessment & Plan Note (Signed)
A voluntary discussion about advance care planning including the explanation and discussion of advance directives was extensively discussed  with the patient.  Explanation about the health care proxy and Living will was reviewed and packet with forms with explanation of how to fill them out was given.    

## 2017-02-01 ENCOUNTER — Encounter: Payer: Self-pay | Admitting: Family Medicine

## 2017-02-01 LAB — CBC WITH DIFFERENTIAL/PLATELET
BASOS ABS: 0 10*3/uL (ref 0.0–0.2)
Basos: 0 %
EOS (ABSOLUTE): 0.1 10*3/uL (ref 0.0–0.4)
Eos: 3 %
HEMOGLOBIN: 14.5 g/dL (ref 13.0–17.7)
Hematocrit: 42.5 % (ref 37.5–51.0)
Immature Grans (Abs): 0 10*3/uL (ref 0.0–0.1)
Immature Granulocytes: 0 %
LYMPHS ABS: 1 10*3/uL (ref 0.7–3.1)
Lymphs: 19 %
MCH: 29 pg (ref 26.6–33.0)
MCHC: 34.1 g/dL (ref 31.5–35.7)
MCV: 85 fL (ref 79–97)
MONOCYTES: 9 %
Monocytes Absolute: 0.5 10*3/uL (ref 0.1–0.9)
Neutrophils Absolute: 3.5 10*3/uL (ref 1.4–7.0)
Neutrophils: 69 %
PLATELETS: 206 10*3/uL (ref 150–379)
RBC: 5 x10E6/uL (ref 4.14–5.80)
RDW: 13.4 % (ref 12.3–15.4)
WBC: 5.1 10*3/uL (ref 3.4–10.8)

## 2017-02-01 LAB — COMPREHENSIVE METABOLIC PANEL
ALBUMIN: 4.5 g/dL (ref 3.5–4.8)
ALT: 11 IU/L (ref 0–44)
AST: 15 IU/L (ref 0–40)
Albumin/Globulin Ratio: 2 (ref 1.2–2.2)
Alkaline Phosphatase: 76 IU/L (ref 39–117)
BUN/Creatinine Ratio: 11 (ref 10–24)
BUN: 12 mg/dL (ref 8–27)
Bilirubin Total: 0.4 mg/dL (ref 0.0–1.2)
CALCIUM: 9 mg/dL (ref 8.6–10.2)
CO2: 26 mmol/L (ref 20–29)
CREATININE: 1.07 mg/dL (ref 0.76–1.27)
Chloride: 105 mmol/L (ref 96–106)
GFR calc Af Amer: 78 mL/min/{1.73_m2} (ref >59)
GFR calc non Af Amer: 67 mL/min/{1.73_m2} (ref >59)
GLOBULIN, TOTAL: 2.3 g/dL (ref 1.5–4.5)
Glucose: 83 mg/dL (ref 65–99)
Potassium: 4.3 mmol/L (ref 3.5–5.2)
SODIUM: 145 mmol/L — AB (ref 134–144)
Total Protein: 6.8 g/dL (ref 6.0–8.5)

## 2017-02-01 LAB — TSH: TSH: 1.91 u[IU]/mL (ref 0.450–4.500)

## 2017-03-08 ENCOUNTER — Ambulatory Visit (INDEPENDENT_AMBULATORY_CARE_PROVIDER_SITE_OTHER): Payer: Medicare Other | Admitting: Family Medicine

## 2017-03-08 ENCOUNTER — Encounter: Payer: Self-pay | Admitting: Family Medicine

## 2017-03-08 VITALS — BP 138/70 | HR 76 | Wt 194.0 lb

## 2017-03-08 DIAGNOSIS — I10 Essential (primary) hypertension: Secondary | ICD-10-CM | POA: Diagnosis not present

## 2017-03-08 MED ORDER — BENAZEPRIL HCL 40 MG PO TABS: 40.0000 mg | ORAL_TABLET | Freq: Every day | ORAL | 4 refills | Status: DC

## 2017-03-08 NOTE — Addendum Note (Signed)
Addended by: Golden Pop A on: 03/08/2017 11:27 AM   Modules accepted: Orders

## 2017-03-08 NOTE — Progress Notes (Signed)
BP 138/70 (BP Location: Left Arm)   Pulse 76   Wt 194 lb (88 kg)   SpO2 98%   BMI 27.84 kg/m    Subjective:    Patient ID: Carl Fernandez, male    DOB: 06/30/1940, 76 y.o.   MRN: [id number]  HPI: Carl Fernandez is a 76 y.o. male  Chief Complaint  Patient presents with  . Follow-up   Patient follow-up blood pressure doing well with Benzapril home checks gets pretty good readings no issues with taking medications no side effects. Patient has noticed some occasional burning of both feet. This just comes on intermittently. No known trauma or irritation. Relevant past medical, surgical, family and social history reviewed and updated as indicated. Interim medical history since our last visit reviewed. Allergies and medications reviewed and updated.  Review of Systems  Constitutional: Negative.   Respiratory: Negative.   Cardiovascular: Negative.     Per HPI unless specifically indicated above     Objective:    BP 138/70 (BP Location: Left Arm)   Pulse 76   Wt 194 lb (88 kg)   SpO2 98%   BMI 27.84 kg/m   Wt Readings from Last 3 Encounters:  03/08/17 194 lb (88 kg)  01/31/17 196 lb (88.9 kg)  01/26/17 195 lb 11.2 oz (88.8 kg)    Physical Exam  Constitutional: He is oriented to person, place, and time. He appears well-developed and well-nourished.  HENT:  Head: Normocephalic and atraumatic.  Eyes: Conjunctivae and EOM are normal.  Neck: Normal range of motion.  Cardiovascular: Normal rate, regular rhythm and normal heart sounds.   Pulmonary/Chest: Effort normal and breath sounds normal.  Musculoskeletal: Normal range of motion.  Neurological: He is alert and oriented to person, place, and time.  Skin: No erythema.  Psychiatric: He has a normal mood and affect. His behavior is normal. Judgment and thought content normal.    Results for orders placed or performed in visit on 01/31/17  Microscopic Examination  Result Value Ref Range   WBC, UA 0-5 0 - 5 /hpf   RBC,  UA 0-2 0 - 2 /hpf   Epithelial Cells (non renal) CANCELED    Bacteria, UA None seen None seen/Few  CBC with Differential/Platelet  Result Value Ref Range   WBC 5.1 3.4 - 10.8 x10E3/uL   RBC 5.00 4.14 - 5.80 x10E6/uL   Hemoglobin 14.5 13.0 - 17.7 g/dL   Hematocrit 42.5 37.5 - 51.0 %   MCV 85 79 - 97 fL   MCH 29.0 26.6 - 33.0 pg   MCHC 34.1 31.5 - 35.7 g/dL   RDW 13.4 12.3 - 15.4 %   Platelets 206 150 - 379 x10E3/uL   Neutrophils 69 Not Estab. %   Lymphs 19 Not Estab. %   Monocytes 9 Not Estab. %   Eos 3 Not Estab. %   Basos 0 Not Estab. %   Neutrophils Absolute 3.5 1.4 - 7.0 x10E3/uL   Lymphocytes Absolute 1.0 0.7 - 3.1 x10E3/uL   Monocytes Absolute 0.5 0.1 - 0.9 x10E3/uL   EOS (ABSOLUTE) 0.1 0.0 - 0.4 x10E3/uL   Basophils Absolute 0.0 0.0 - 0.2 x10E3/uL   Immature Granulocytes 0 Not Estab. %   Immature Grans (Abs) 0.0 0.0 - 0.1 x10E3/uL  Comprehensive metabolic panel  Result Value Ref Range   Glucose 83 65 - 99 mg/dL   BUN 12 8 - 27 mg/dL   Creatinine, Ser 1.07 0.76 - 1.27 mg/dL   GFR calc non Af  Amer 67 >59 mL/min/1.73   GFR calc Af Amer 78 >59 mL/min/1.73   BUN/Creatinine Ratio 11 10 - 24   Sodium 145 (H) 134 - 144 mmol/L   Potassium 4.3 3.5 - 5.2 mmol/L   Chloride 105 96 - 106 mmol/L   CO2 26 20 - 29 mmol/L   Calcium 9.0 8.6 - 10.2 mg/dL   Total Protein 6.8 6.0 - 8.5 g/dL   Albumin 4.5 3.5 - 4.8 g/dL   Globulin, Total 2.3 1.5 - 4.5 g/dL   Albumin/Globulin Ratio 2.0 1.2 - 2.2   Bilirubin Total 0.4 0.0 - 1.2 mg/dL   Alkaline Phosphatase 76 39 - 117 IU/L   AST 15 0 - 40 IU/L   ALT 11 0 - 44 IU/L  TSH  Result Value Ref Range   TSH 1.910 0.450 - 4.500 uIU/mL  Urinalysis, Routine w reflex microscopic  Result Value Ref Range   Specific Gravity, UA 1.015 1.005 - 1.030   pH, UA 7.0 5.0 - 7.5   Color, UA Yellow Yellow   Appearance Ur Clear Clear   Leukocytes, UA Negative Negative   Protein, UA Negative Negative/Trace   Glucose, UA Negative Negative   Ketones, UA  Negative Negative   RBC, UA Negative Negative   Bilirubin, UA Negative Negative   Urobilinogen, Ur 0.2 0.2 - 1.0 mg/dL   Nitrite, UA Negative Negative   Microscopic Examination See below:       Assessment & Plan:   Problem List Items Addressed This Visit      Cardiovascular and Mediastinum   Essential hypertension - Primary    The current medical regimen is effective;  continue present plan and medications.       Relevant Orders   Basic metabolic panel      Neuropathy feet  Discussed care and tx  Follow up plan: Return in about 6 months (around 09/05/2017).

## 2017-03-08 NOTE — Assessment & Plan Note (Signed)
The current medical regimen is effective;  continue present plan and medications.  

## 2017-03-09 ENCOUNTER — Encounter: Payer: Self-pay | Admitting: Family Medicine

## 2017-03-09 LAB — BASIC METABOLIC PANEL
BUN/Creatinine Ratio: 10 (ref 10–24)
BUN: 11 mg/dL (ref 8–27)
CALCIUM: 8.9 mg/dL (ref 8.6–10.2)
CO2: 24 mmol/L (ref 20–29)
CREATININE: 1.08 mg/dL (ref 0.76–1.27)
Chloride: 106 mmol/L (ref 96–106)
GFR calc Af Amer: 77 mL/min/{1.73_m2} (ref >59)
GFR, EST NON AFRICAN AMERICAN: 66 mL/min/{1.73_m2} (ref >59)
GLUCOSE: 86 mg/dL (ref 65–99)
POTASSIUM: 4.4 mmol/L (ref 3.5–5.2)
SODIUM: 144 mmol/L (ref 134–144)

## 2017-04-06 DIAGNOSIS — G4733 Obstructive sleep apnea (adult) (pediatric): Secondary | ICD-10-CM | POA: Diagnosis not present

## 2017-04-06 DIAGNOSIS — R05 Cough: Secondary | ICD-10-CM | POA: Diagnosis not present

## 2017-04-06 DIAGNOSIS — J4520 Mild intermittent asthma, uncomplicated: Secondary | ICD-10-CM | POA: Diagnosis not present

## 2017-04-06 DIAGNOSIS — R490 Dysphonia: Secondary | ICD-10-CM | POA: Diagnosis not present

## 2017-04-19 DIAGNOSIS — J159 Unspecified bacterial pneumonia: Secondary | ICD-10-CM | POA: Diagnosis not present

## 2017-07-17 DIAGNOSIS — D2261 Melanocytic nevi of right upper limb, including shoulder: Secondary | ICD-10-CM | POA: Diagnosis not present

## 2017-07-17 DIAGNOSIS — D225 Melanocytic nevi of trunk: Secondary | ICD-10-CM | POA: Diagnosis not present

## 2017-07-17 DIAGNOSIS — X32XXXA Exposure to sunlight, initial encounter: Secondary | ICD-10-CM | POA: Diagnosis not present

## 2017-07-17 DIAGNOSIS — D2272 Melanocytic nevi of left lower limb, including hip: Secondary | ICD-10-CM | POA: Diagnosis not present

## 2017-07-17 DIAGNOSIS — L570 Actinic keratosis: Secondary | ICD-10-CM | POA: Diagnosis not present

## 2017-07-17 DIAGNOSIS — Z85828 Personal history of other malignant neoplasm of skin: Secondary | ICD-10-CM | POA: Diagnosis not present

## 2017-08-10 ENCOUNTER — Encounter: Payer: Self-pay | Admitting: Family Medicine

## 2017-09-05 ENCOUNTER — Ambulatory Visit: Payer: Medicare Other | Admitting: Family Medicine

## 2017-09-13 ENCOUNTER — Ambulatory Visit (INDEPENDENT_AMBULATORY_CARE_PROVIDER_SITE_OTHER): Payer: Medicare Other | Admitting: Family Medicine

## 2017-09-13 ENCOUNTER — Encounter: Payer: Self-pay | Admitting: Family Medicine

## 2017-09-13 DIAGNOSIS — J42 Unspecified chronic bronchitis: Secondary | ICD-10-CM

## 2017-09-13 DIAGNOSIS — I10 Essential (primary) hypertension: Secondary | ICD-10-CM

## 2017-09-13 DIAGNOSIS — Z9989 Dependence on other enabling machines and devices: Secondary | ICD-10-CM

## 2017-09-13 DIAGNOSIS — E7800 Pure hypercholesterolemia, unspecified: Secondary | ICD-10-CM | POA: Diagnosis not present

## 2017-09-13 DIAGNOSIS — G4733 Obstructive sleep apnea (adult) (pediatric): Secondary | ICD-10-CM

## 2017-09-13 NOTE — Assessment & Plan Note (Signed)
The current medical regimen is effective;  continue present plan and medications.  

## 2017-09-13 NOTE — Progress Notes (Signed)
BP 132/68 (BP Location: Left Arm)   Pulse 73   Ht 5\' 10"  (1.778 m)   Wt 195 lb 11.2 oz (88.8 kg)   SpO2 96%   BMI 28.08 kg/m    Subjective:    Patient ID: Carl Fernandez, male    DOB: 19-Nov-1940, 77 y.o.   MRN: [id number]  HPI: Carl Fernandez is a 77 y.o. male  Chief Complaint  Patient presents with  . Follow-up  Patient follow-up doing well blood pressure no complaints taking medications faithfully without problems. Concerned about some irritability wondering if medications may be contributing. Reviewed side effect list from medication and Singulair may remotely be contributing to patient's symptoms patient will try holding Singulair and observing. Patient's breathing all in all is been doing well rare use of albuterol.   Relevant past medical, surgical, family and social history reviewed and updated as indicated. Interim medical history since our last visit reviewed. Allergies and medications reviewed and updated.  Review of Systems  Constitutional: Negative.   Respiratory: Negative.   Cardiovascular: Negative.     Per HPI unless specifically indicated above     Objective:    BP 132/68 (BP Location: Left Arm)   Pulse 73   Ht 5\' 10"  (1.778 m)   Wt 195 lb 11.2 oz (88.8 kg)   SpO2 96%   BMI 28.08 kg/m   Wt Readings from Last 3 Encounters:  09/13/17 195 lb 11.2 oz (88.8 kg)  03/08/17 194 lb (88 kg)  01/31/17 196 lb (88.9 kg)    Physical Exam  Constitutional: He is oriented to person, place, and time. He appears well-developed and well-nourished.  HENT:  Head: Normocephalic and atraumatic.  Eyes: Conjunctivae and EOM are normal.  Neck: Normal range of motion.  Cardiovascular: Normal rate, regular rhythm and normal heart sounds.  Pulmonary/Chest: Effort normal and breath sounds normal.  Musculoskeletal: Normal range of motion.  Neurological: He is alert and oriented to person, place, and time.  Skin: No erythema.  Psychiatric: He has a normal mood and affect.  His behavior is normal. Judgment and thought content normal.    Results for orders placed or performed in visit on 20/25/42  Basic metabolic panel  Result Value Ref Range   Glucose 86 65 - 99 mg/dL   BUN 11 8 - 27 mg/dL   Creatinine, Ser 1.08 0.76 - 1.27 mg/dL   GFR calc non Af Amer 66 >59 mL/min/1.73   GFR calc Af Amer 77 >59 mL/min/1.73   BUN/Creatinine Ratio 10 10 - 24   Sodium 144 134 - 144 mmol/L   Potassium 4.4 3.5 - 5.2 mmol/L   Chloride 106 96 - 106 mmol/L   CO2 24 20 - 29 mmol/L   Calcium 8.9 8.6 - 10.2 mg/dL      Assessment & Plan:   Problem List Items Addressed This Visit      Cardiovascular and Mediastinum   Essential hypertension    The current medical regimen is effective;  continue present plan and medications.         Respiratory   COPD (chronic obstructive pulmonary disease) (HCC)    The current medical regimen is effective;  continue present plan and medications.       OSA on CPAP    The current medical regimen is effective;  continue present plan and medications.         Other   Hypercholesteremia    The current medical regimen is effective;  continue present plan and  medications.           Follow up plan: Return in about 6 months (around 03/16/2018) for Physical Exam.

## 2017-09-28 DIAGNOSIS — G4733 Obstructive sleep apnea (adult) (pediatric): Secondary | ICD-10-CM | POA: Diagnosis not present

## 2017-09-28 DIAGNOSIS — J4520 Mild intermittent asthma, uncomplicated: Secondary | ICD-10-CM | POA: Diagnosis not present

## 2017-10-06 DIAGNOSIS — H2513 Age-related nuclear cataract, bilateral: Secondary | ICD-10-CM | POA: Diagnosis not present

## 2017-12-25 ENCOUNTER — Other Ambulatory Visit: Payer: Self-pay | Admitting: Family Medicine

## 2017-12-25 DIAGNOSIS — J309 Allergic rhinitis, unspecified: Secondary | ICD-10-CM

## 2017-12-25 NOTE — Telephone Encounter (Signed)
Fluticasone refill Last Refill:01/16/16 # 48g 4 RF Last OV: 09/23/17 PCP: Dr Jeananne Rama Pharmacy:[facility]

## 2018-02-14 ENCOUNTER — Other Ambulatory Visit: Payer: Self-pay | Admitting: Family Medicine

## 2018-02-14 DIAGNOSIS — J42 Unspecified chronic bronchitis: Secondary | ICD-10-CM

## 2018-03-22 ENCOUNTER — Ambulatory Visit (INDEPENDENT_AMBULATORY_CARE_PROVIDER_SITE_OTHER): Payer: Medicare Other

## 2018-03-22 VITALS — BP 128/62 | HR 71 | Temp 97.8°F | Ht 70.0 in | Wt 195.0 lb

## 2018-03-22 DIAGNOSIS — Z23 Encounter for immunization: Secondary | ICD-10-CM

## 2018-03-22 DIAGNOSIS — Z0000 Encounter for general adult medical examination without abnormal findings: Secondary | ICD-10-CM | POA: Diagnosis not present

## 2018-03-22 MED ORDER — ZOSTER VAC RECOMB ADJUVANTED 50 MCG/0.5ML IM SUSR: 0.5000 mL | Freq: Once | INTRAMUSCULAR | 1 refills | Status: AC

## 2018-03-22 NOTE — Progress Notes (Signed)
Subjective:   Carl Fernandez is a 77 y.o. male who presents for Medicare Annual/Subsequent preventive examination.  Last AWV-01/26/2017    Objective:    Vitals: BP 128/62 (BP Location: Left Arm, Patient Position: Sitting)   Pulse 71   Temp 97.8 F (36.6 C) (Oral)   Ht 5\' 10"  (1.778 m)   Wt 195 lb (88.5 kg)   SpO2 96%   BMI 27.98 kg/m   Body mass index is 27.98 kg/m.  Advanced Directives 03/22/2018 01/26/2017  Does Patient Have a Medical Advance Directive? Yes Yes  Type of Paramedic of [place];Living will [place];Living will  Does patient want to make changes to medical advance directive? No - Patient declined -  Copy of [place] in Chart? No - copy requested No - copy requested    Tobacco Social History   Tobacco Use  Smoking Status Never Smoker  Smokeless Tobacco Never Used     Counseling given: Not Answered   Clinical Intake:  Pre-visit preparation completed: No  Pain : No/denies pain     Diabetes: No  How often do you need to have someone help you when you read instructions, pamphlets, or other written materials from your doctor or pharmacy?: 1 - Never What is the last grade level you completed in school?: college  Interpreter Needed?: No  Information entered by :: Tyson Dense, RN  Past Medical History:  Diagnosis Date  . COPD (chronic obstructive pulmonary disease) ([place])   . Diverticulitis   . GERD (gastroesophageal reflux disease)   . Hard of hearing   . Kidney stones   . OSA on CPAP   . Plantar fasciitis    Past Surgical History:  Procedure Laterality Date  . LITHOTRIPSY    . nasoplasty     Family History  Problem Relation Age of Onset  . Hypertension Mother   . Osteoporosis Mother   . Cancer Mother        skin on her nose  . Heart disease Father    Social History   Socioeconomic History  . Marital status: Married    Spouse name: Not on file  . Number of  children: Not on file  . Years of education: Not on file  . Highest education level: Not on file  Occupational History  . Not on file  Social Needs  . Financial resource strain: Not hard at all  . Food insecurity:    Worry: Never true    Inability: Never true  . Transportation needs:    Medical: No    Non-medical: No  Tobacco Use  . Smoking status: Never Smoker  . Smokeless tobacco: Never Used  Substance and Sexual Activity  . Alcohol use: No  . Drug use: No  . Sexual activity: Not on file  Lifestyle  . Physical activity:    Days per week: 0 days    Minutes per session: 0 min  . Stress: Not at all  Relationships  . Social connections:    Talks on phone: Three times a week    Gets together: Three times a week    Attends religious service: More than 4 times per year    Active member of club or organization: No    Attends meetings of clubs or organizations: Never    Relationship status: Married  Other Topics Concern  . Not on file  Social History Narrative  . Not on file    Outpatient Encounter Medications  as of 03/22/2018  Medication Sig  . albuterol (PROAIR HFA) 108 (90 Base) MCG/ACT inhaler Inhale 2 puffs into the lungs every 6 (six) hours as needed for wheezing or shortness of breath.  . benazepril (LOTENSIN) 40 MG tablet Take 1 tablet (40 mg total) by mouth daily.  . fluticasone (FLONASE) 50 MCG/ACT nasal spray Place 2 sprays into both nostrils daily.  . Fluticasone-Salmeterol (ADVAIR DISKUS) 250-50 MCG/DOSE AEPB Inhale 1 puff into the lungs 2 (two) times daily.  . montelukast (SINGULAIR) 10 MG tablet Take 1 tablet (10 mg total) by mouth at bedtime.  Marland Kitchen omeprazole (PRILOSEC) 40 MG capsule Take 1 capsule (40 mg total) by mouth daily.  . DENTA 5000 PLUS 1.1 % CREA dental cream    No facility-administered encounter medications on file as of 03/22/2018.     Activities of Daily Living In your present state of health, do you have any difficulty performing the following  activities: 03/22/2018  Hearing? N  Vision? N  Difficulty concentrating or making decisions? N  Walking or climbing stairs? N  Dressing or bathing? N  Doing errands, shopping? N  Preparing Food and eating ? N  Using the Toilet? N  In the past six months, have you accidently leaked urine? Y  Do you have problems with loss of bowel control? N  Managing your Medications? N  Managing your Finances? N  Housekeeping or managing your Housekeeping? N  Some recent data might be hidden    Patient Care Team: Guadalupe Maple, MD as PCP - General (Family Medicine) Erby Pian, MD as Referring Physician (Specialist) Dasher, Rayvon Char, MD (Dermatology) Manya Silvas, MD (Gastroenterology)   Assessment:   This is a routine wellness examination for Carl Fernandez.  Exercise Activities and Dietary recommendations Current Exercise Habits: The patient does not participate in regular exercise at present, Exercise limited by: None identified  Goals   None     Fall Risk Fall Risk  03/22/2018 09/13/2017 03/08/2017 01/31/2017 01/26/2017  Falls in the past year? 0 No No No No  Number falls in past yr: 0 - - - -  Injury with Fall? 0 - - - -   Is the patient's home free of loose throw rugs in walkways, pet beds, electrical cords, etc?   yes      Grab bars in the bathroom? yes      Handrails on the stairs?   yes      Adequate lighting?   yes   Depression Screen PHQ 2/9 Scores 03/22/2018 09/13/2017 03/08/2017 01/31/2017  PHQ - 2 Score 0 0 0 0    Cognitive Function     6CIT Screen 03/22/2018 01/26/2017  What Year? 0 points 0 points  What month? 0 points 0 points  What time? 0 points 0 points  Count back from 20 0 points 0 points  Months in reverse 0 points 0 points  Repeat phrase 0 points 0 points  Total Score 0 0    Immunization History  Administered Date(s) Administered  . Influenza, High Dose Seasonal PF 01/26/2016, 01/26/2017  . Influenza,inj,Quad PF,6+ Mos 04/29/2015  .  Influenza-Unspecified 01/07/2014  . Pneumococcal Conjugate-13 04/29/2015  . Pneumococcal Polysaccharide-23 01/26/2017  . Tdap 10/09/2013    Qualifies for Shingles Vaccine? Yes, educated and ordered to pharmacy  Screening Tests Health Maintenance  Topic Date Due  . INFLUENZA VACCINE  12/07/2017  . TETANUS/TDAP  10/10/2023  . PNA vac Low Risk Adult  Completed   Cancer Screenings: Lung: Low  Dose CT Chest recommended if Age 68-80 years, 30 pack-year currently smoking OR have quit w/in 15years. Patient does not qualify. Colorectal: up to date  Additional Screenings:  Hepatitis C Screening: declined High dose flu vaccine given      Plan:    I have personally reviewed and addressed the Medicare Annual Wellness questionnaire and have noted the following in the patient's chart:  A. Medical and social history B. Use of alcohol, tobacco or illicit drugs  C. Current medications and supplements D. Functional ability and status E.  Nutritional status F.  Physical activity G. Advance directives H. List of other physicians I.  Hospitalizations, surgeries, and ER visits in previous 12 months J.  [place] to include hearing, vision, cognitive, depression L. Referrals and appointments - none  In addition, I have reviewed and discussed with patient certain preventive protocols, quality metrics, and best practice recommendations. A written personalized care plan for preventive services as well as general preventive health recommendations were provided to patient.  See attached scanned questionnaire for additional information.   Signed,   Tyson Dense, RN Nurse Health Advisor  Patient Concerns: None

## 2018-03-22 NOTE — Patient Instructions (Signed)
[name] , Thank you for taking time to come for your Medicare Wellness Visit. I appreciate your ongoing commitment to your health goals. Please review the following plan we discussed and let me know if I can assist you in the future.   Screening recommendations/referrals: Colonoscopy excluded, over age 77 Recommended yearly ophthalmology/optometry visit for glaucoma screening and checkup Recommended yearly dental visit for hygiene and checkup  Vaccinations: Influenza vaccine high dose given today Pneumococcal vaccine up to date, completed Tdap vaccine up to date, due 10/10/2023 Shingles vaccine due, ordered to pharmacy    Advanced directives:pPlease bring a copy of living will and health care power of attorney to our office so we can scan it into your chart.  Conditions/risks identified: none  Next appointment: Dr Freddi Starr 03/27/2018 @ 10am           Medicare Wellness Visit 03/25/2019 @ 10:15am  Preventive Care 75 Years and Older, Male Preventive care refers to lifestyle choices and visits with your health care provider that can promote health and wellness. What does preventive care include?  A yearly physical exam. This is also called an annual well check.  Dental exams once or twice a year.  Routine eye exams. Ask your health care provider how often you should have your eyes checked.  Personal lifestyle choices, including:  Daily care of your teeth and gums.  Regular physical activity.  Eating a healthy diet.  Avoiding tobacco and drug use.  Limiting alcohol use.  Practicing safe sex.  Taking low doses of aspirin every day.  Taking vitamin and mineral supplements as recommended by your health care provider. What happens during an annual well check? The services and screenings done by your health care provider during your annual well check will depend on your age, overall health, lifestyle risk factors, and family history of disease. Counseling  Your health care  provider may ask you questions about your:  Alcohol use.  Tobacco use.  Drug use.  Emotional well-being.  Home and relationship well-being.  Sexual activity.  Eating habits.  History of falls.  Memory and ability to understand (cognition).  Work and work Statistician. Screening  You may have the following tests or measurements:  Height, weight, and BMI.  Blood pressure.  Lipid and cholesterol levels. These may be checked every 5 years, or more frequently if you are over 74 years old.  Skin check.  Lung cancer screening. You may have this screening every year starting at age 48 if you have a 30-pack-year history of smoking and currently smoke or have quit within the past 15 years.  Fecal occult blood test (FOBT) of the stool. You may have this test every year starting at age 27.  Flexible sigmoidoscopy or colonoscopy. You may have a sigmoidoscopy every 5 years or a colonoscopy every 10 years starting at age 60.  Prostate cancer screening. Recommendations will vary depending on your family history and other risks.  Hepatitis C blood test.  Hepatitis B blood test.  Sexually transmitted disease (STD) testing.  Diabetes screening. This is done by checking your blood sugar (glucose) after you have not eaten for a while (fasting). You may have this done every 1-3 years.  Abdominal aortic aneurysm (AAA) screening. You may need this if you are a current or former smoker.  Osteoporosis. You may be screened starting at age 61 if you are at high risk. Talk with your health care provider about your test results, treatment options, and if necessary, the need for  more tests. Vaccines  Your health care provider may recommend certain vaccines, such as:  Influenza vaccine. This is recommended every year.  Tetanus, diphtheria, and acellular pertussis (Tdap, Td) vaccine. You may need a Td booster every 10 years.  Zoster vaccine. You may need this after age 73.  Pneumococcal  13-valent conjugate (PCV13) vaccine. One dose is recommended after age 51.  Pneumococcal polysaccharide (PPSV23) vaccine. One dose is recommended after age 7. Talk to your health care provider about which screenings and vaccines you need and how often you need them. This information is not intended to replace advice given to you by your health care provider. Make sure you discuss any questions you have with your health care provider. Document Released: 05/22/2015 Document Revised: 01/13/2016 Document Reviewed: 02/24/2015 Elsevier Interactive Patient Education  2017 [place] Prevention in the Home Falls can cause injuries. They can happen to people of all ages. There are many things you can do to make your home safe and to help prevent falls. What can I do on the outside of my home?  Regularly fix the edges of walkways and driveways and fix any cracks.  Remove anything that might make you trip as you walk through a door, such as a raised step or threshold.  Trim any bushes or trees on the path to your home.  Use bright outdoor lighting.  Clear any walking paths of anything that might make someone trip, such as rocks or tools.  Regularly check to see if handrails are loose or broken. Make sure that both sides of any steps have handrails.  Any raised decks and porches should have guardrails on the edges.  Have any leaves, snow, or ice cleared regularly.  Use sand or salt on walking paths during winter.  Clean up any spills in your garage right away. This includes oil or grease spills. What can I do in the bathroom?  Use night lights.  Install grab bars by the toilet and in the tub and shower. Do not use towel bars as grab bars.  Use non-skid mats or decals in the tub or shower.  If you need to sit down in the shower, use a plastic, non-slip stool.  Keep the floor dry. Clean up any water that spills on the floor as soon as it happens.  Remove soap buildup in the  tub or shower regularly.  Attach bath mats securely with double-sided non-slip rug tape.  Do not have throw rugs and other things on the floor that can make you trip. What can I do in the bedroom?  Use night lights.  Make sure that you have a light by your bed that is easy to reach.  Do not use any sheets or blankets that are too big for your bed. They should not hang down onto the floor.  Have a firm chair that has side arms. You can use this for support while you get dressed.  Do not have throw rugs and other things on the floor that can make you trip. What can I do in the kitchen?  Clean up any spills right away.  Avoid walking on wet floors.  Keep items that you use a lot in easy-to-reach places.  If you need to reach something above you, use a strong step stool that has a grab bar.  Keep electrical cords out of the way.  Do not use floor polish or wax that makes floors slippery. If you must use wax, use non-skid  floor wax.  Do not have throw rugs and other things on the floor that can make you trip. What can I do with my stairs?  Do not leave any items on the stairs.  Make sure that there are handrails on both sides of the stairs and use them. Fix handrails that are broken or loose. Make sure that handrails are as long as the stairways.  Check any carpeting to make sure that it is firmly attached to the stairs. Fix any carpet that is loose or worn.  Avoid having throw rugs at the top or bottom of the stairs. If you do have throw rugs, attach them to the floor with carpet tape.  Make sure that you have a light switch at the top of the stairs and the bottom of the stairs. If you do not have them, ask someone to add them for you. What else can I do to help prevent falls?  Wear shoes that:  Do not have high heels.  Have rubber bottoms.  Are comfortable and fit you well.  Are closed at the toe. Do not wear sandals.  If you use a stepladder:  Make sure that it  is fully opened. Do not climb a closed stepladder.  Make sure that both sides of the stepladder are locked into place.  Ask someone to hold it for you, if possible.  Clearly mark and make sure that you can see:  Any grab bars or handrails.  First and last steps.  Where the edge of each step is.  Use tools that help you move around (mobility aids) if they are needed. These include:  Canes.  Walkers.  Scooters.  Crutches.  Turn on the lights when you go into a dark area. Replace any light bulbs as soon as they burn out.  Set up your furniture so you have a clear path. Avoid moving your furniture around.  If any of your floors are uneven, fix them.  If there are any pets around you, be aware of where they are.  Review your medicines with your doctor. Some medicines can make you feel dizzy. This can increase your chance of falling. Ask your doctor what other things that you can do to help prevent falls. This information is not intended to replace advice given to you by your health care provider. Make sure you discuss any questions you have with your health care provider. Document Released: 02/19/2009 Document Revised: 10/01/2015 Document Reviewed: 05/30/2014 Elsevier Interactive Patient Education  2017 [employer].

## 2018-03-22 NOTE — Addendum Note (Signed)
Addended by: Tyson Dense E on: 03/22/2018 10:36 AM   Modules accepted: Orders

## 2018-03-27 ENCOUNTER — Encounter: Payer: Self-pay | Admitting: Family Medicine

## 2018-03-27 ENCOUNTER — Ambulatory Visit (INDEPENDENT_AMBULATORY_CARE_PROVIDER_SITE_OTHER): Payer: Medicare Other | Admitting: Family Medicine

## 2018-03-27 VITALS — BP 138/75 | HR 76 | Temp 98.3°F | Ht 70.0 in | Wt 194.6 lb

## 2018-03-27 DIAGNOSIS — G4733 Obstructive sleep apnea (adult) (pediatric): Secondary | ICD-10-CM | POA: Diagnosis not present

## 2018-03-27 DIAGNOSIS — Z9989 Dependence on other enabling machines and devices: Secondary | ICD-10-CM

## 2018-03-27 DIAGNOSIS — I10 Essential (primary) hypertension: Secondary | ICD-10-CM | POA: Diagnosis not present

## 2018-03-27 DIAGNOSIS — E7800 Pure hypercholesterolemia, unspecified: Secondary | ICD-10-CM

## 2018-03-27 DIAGNOSIS — Z7189 Other specified counseling: Secondary | ICD-10-CM

## 2018-03-27 DIAGNOSIS — K219 Gastro-esophageal reflux disease without esophagitis: Secondary | ICD-10-CM

## 2018-03-27 DIAGNOSIS — J309 Allergic rhinitis, unspecified: Secondary | ICD-10-CM

## 2018-03-27 DIAGNOSIS — J42 Unspecified chronic bronchitis: Secondary | ICD-10-CM

## 2018-03-27 LAB — URINALYSIS, ROUTINE W REFLEX MICROSCOPIC
Bilirubin, UA: NEGATIVE
Glucose, UA: NEGATIVE
KETONES UA: NEGATIVE
LEUKOCYTES UA: NEGATIVE
Nitrite, UA: NEGATIVE
PH UA: 6.5 (ref 5.0–7.5)
Protein, UA: NEGATIVE
RBC UA: NEGATIVE
SPEC GRAV UA: 1.015 (ref 1.005–1.030)
UUROB: 0.2 mg/dL (ref 0.2–1.0)

## 2018-03-27 MED ORDER — BENAZEPRIL HCL 40 MG PO TABS: 40.0000 mg | ORAL_TABLET | Freq: Every day | ORAL | 4 refills | Status: DC

## 2018-03-27 MED ORDER — FLUTICASONE PROPIONATE 50 MCG/ACT NA SUSP: 2.0000 | Freq: Every day | NASAL | 12 refills | Status: DC

## 2018-03-27 MED ORDER — MONTELUKAST SODIUM 10 MG PO TABS: 10.0000 mg | ORAL_TABLET | Freq: Every day | ORAL | 4 refills | Status: DC

## 2018-03-27 MED ORDER — OMEPRAZOLE 40 MG PO CPDR: 40.0000 mg | DELAYED_RELEASE_CAPSULE | Freq: Every day | ORAL | 4 refills | Status: DC

## 2018-03-27 NOTE — Assessment & Plan Note (Signed)
The current medical regimen is effective;  continue present plan and medications.  

## 2018-03-27 NOTE — Assessment & Plan Note (Signed)
A voluntary discussion about advanced care planning including explanation and discussion of advanced directives was extentively discussed with the patient.  Explained about the healthcare proxy and living will was reviewed and packet with forms with expiration of how to fill them out was given.  Time spent: Encounter 16+ min individuals present: Patient 

## 2018-03-27 NOTE — Progress Notes (Signed)
BP 138/75 (BP Location: Left Arm, Cuff Size: Normal)   Pulse 76   Temp 98.3 F (36.8 C) (Oral)   Ht 5\' 10"  (1.778 m)   Wt 194 lb 9.6 oz (88.3 kg)   SpO2 92%   BMI 27.92 kg/m    Subjective:    Patient ID: Carl Fernandez, male    DOB: [birth year], 77 y.o.   MRN: [id number]  HPI: Carl Fernandez is a 77 y.o. male  Chief Complaint  Patient presents with  . Annual Exam    pt had wellness exam 03/22/18  Patient with occasional catch in his right hip no specific time or action does come to mind but not enough for Tylenol. Blood pressure doing well with Benzapril no issues. Doing okay with inhalers no issues or breathing problems. Reflux also doing well.  Relevant past medical, surgical, family and social history reviewed and updated as indicated. Interim medical history since our last visit reviewed. Allergies and medications reviewed and updated.  Review of Systems  Constitutional: Negative.   HENT: Negative.   Eyes: Negative.   Respiratory: Negative.   Cardiovascular: Negative.   Gastrointestinal: Negative.   Endocrine: Negative.   Genitourinary: Negative.   Musculoskeletal: Negative.   Skin: Negative.   Allergic/Immunologic: Negative.   Neurological: Negative.   Hematological: Negative.   Psychiatric/Behavioral: Negative.     Per HPI unless specifically indicated above     Objective:    BP 138/75 (BP Location: Left Arm, Cuff Size: Normal)   Pulse 76   Temp 98.3 F (36.8 C) (Oral)   Ht 5\' 10"  (1.778 m)   Wt 194 lb 9.6 oz (88.3 kg)   SpO2 92%   BMI 27.92 kg/m   Wt Readings from Last 3 Encounters:  03/27/18 194 lb 9.6 oz (88.3 kg)  03/22/18 195 lb (88.5 kg)  09/13/17 195 lb 11.2 oz (88.8 kg)    Physical Exam  Constitutional: He is oriented to person, place, and time. He appears well-developed and well-nourished.  HENT:  Head: Normocephalic and atraumatic.  Right Ear: External ear normal.  Left Ear: External ear normal.  Eyes: Pupils are equal, round, and  reactive to light. Conjunctivae and EOM are normal.  Neck: Normal range of motion. Neck supple.  Cardiovascular: Normal rate, regular rhythm, normal heart sounds and intact distal pulses.  Pulmonary/Chest: Effort normal and breath sounds normal.  Abdominal: Soft. Bowel sounds are normal. There is no splenomegaly or hepatomegaly.  Genitourinary: Rectum normal, prostate normal and penis normal.  Musculoskeletal: Normal range of motion.  Neurological: He is alert and oriented to person, place, and time. He has normal reflexes.  Skin: No rash noted. No erythema.  Psychiatric: He has a normal mood and affect. His behavior is normal. Judgment and thought content normal.    Results for orders placed or performed in visit on 76/16/07  Basic metabolic panel  Result Value Ref Range   Glucose 86 65 - 99 mg/dL   BUN 11 8 - 27 mg/dL   Creatinine, Ser 1.08 0.76 - 1.27 mg/dL   GFR calc non Af Amer 66 >59 mL/min/1.73   GFR calc Af Amer 77 >59 mL/min/1.73   BUN/Creatinine Ratio 10 10 - 24   Sodium 144 134 - 144 mmol/L   Potassium 4.4 3.5 - 5.2 mmol/L   Chloride 106 96 - 106 mmol/L   CO2 24 20 - 29 mmol/L   Calcium 8.9 8.6 - 10.2 mg/dL      Assessment & Plan:  Problem List Items Addressed This Visit      Cardiovascular and Mediastinum   Essential hypertension    The current medical regimen is effective;  continue present plan and medications.         Respiratory   COPD (chronic obstructive pulmonary disease) (HCC)   Relevant Medications   fluticasone-salmeterol (ADVAIR HFA) 115-21 MCG/ACT inhaler   OSA on CPAP    The current medical regimen is effective;  continue present plan and medications.       Allergic rhinitis     Digestive   GERD (gastroesophageal reflux disease)    The current medical regimen is effective;  continue present plan and medications.         Other   Hypercholesteremia    The current medical regimen is effective;  continue present plan and  medications.       Advanced care planning/counseling discussion - Primary    A voluntary discussion about advanced care planning including explanation and discussion of advanced directives was extentively discussed with the patient.  Explained about the healthcare proxy and living will was reviewed and packet with forms with expiration of how to fill them out was given.  Time spent: Encounter 16+ min individuals present: Patient          Follow up plan: Return in about 6 months (around 09/25/2018).

## 2018-03-28 ENCOUNTER — Encounter: Payer: Self-pay | Admitting: Family Medicine

## 2018-03-28 DIAGNOSIS — J4520 Mild intermittent asthma, uncomplicated: Secondary | ICD-10-CM | POA: Diagnosis not present

## 2018-03-28 LAB — COMPREHENSIVE METABOLIC PANEL
A/G RATIO: 1.9 (ref 1.2–2.2)
ALK PHOS: 68 IU/L (ref 39–117)
ALT: 12 IU/L (ref 0–44)
AST: 12 IU/L (ref 0–40)
Albumin: 4.4 g/dL (ref 3.5–4.8)
BILIRUBIN TOTAL: 0.6 mg/dL (ref 0.0–1.2)
BUN / CREAT RATIO: 11 (ref 10–24)
BUN: 13 mg/dL (ref 8–27)
CHLORIDE: 105 mmol/L (ref 96–106)
CO2: 23 mmol/L (ref 20–29)
Calcium: 9.4 mg/dL (ref 8.6–10.2)
Creatinine, Ser: 1.22 mg/dL (ref 0.76–1.27)
GFR calc Af Amer: 66 mL/min/{1.73_m2} (ref >59)
GFR calc non Af Amer: 57 mL/min/{1.73_m2} — ABNORMAL LOW (ref >59)
Globulin, Total: 2.3 g/dL (ref 1.5–4.5)
Glucose: 88 mg/dL (ref 65–99)
POTASSIUM: 4 mmol/L (ref 3.5–5.2)
Sodium: 143 mmol/L (ref 134–144)
Total Protein: 6.7 g/dL (ref 6.0–8.5)

## 2018-03-28 LAB — TSH: TSH: 2.2 u[IU]/mL (ref 0.450–4.500)

## 2018-03-28 LAB — CBC WITH DIFFERENTIAL/PLATELET
BASOS ABS: 0 10*3/uL (ref 0.0–0.2)
BASOS: 0 %
EOS (ABSOLUTE): 0.1 10*3/uL (ref 0.0–0.4)
EOS: 2 %
Hematocrit: 44.5 % (ref 37.5–51.0)
Hemoglobin: 14.9 g/dL (ref 13.0–17.7)
Immature Grans (Abs): 0 10*3/uL (ref 0.0–0.1)
Immature Granulocytes: 0 %
Lymphocytes Absolute: 1 10*3/uL (ref 0.7–3.1)
Lymphs: 13 %
MCH: 29.4 pg (ref 26.6–33.0)
MCHC: 33.5 g/dL (ref 31.5–35.7)
MCV: 88 fL (ref 79–97)
Monocytes Absolute: 0.5 10*3/uL (ref 0.1–0.9)
Monocytes: 7 %
NEUTROS ABS: 5.7 10*3/uL (ref 1.4–7.0)
NEUTROS PCT: 78 %
PLATELETS: 211 10*3/uL (ref 150–450)
RBC: 5.07 x10E6/uL (ref 4.14–5.80)
RDW: 12.4 % (ref 12.3–15.4)
WBC: 7.4 10*3/uL (ref 3.4–10.8)

## 2018-03-28 LAB — LIPID PANEL
CHOLESTEROL TOTAL: 206 mg/dL — AB (ref 100–199)
Chol/HDL Ratio: 5.6 ratio — ABNORMAL HIGH (ref 0.0–5.0)
HDL: 37 mg/dL — ABNORMAL LOW (ref >39)
LDL CALC: 139 mg/dL — AB (ref 0–99)
Triglycerides: 151 mg/dL — ABNORMAL HIGH (ref 0–149)
VLDL CHOLESTEROL CAL: 30 mg/dL (ref 5–40)

## 2018-07-16 DIAGNOSIS — D2262 Melanocytic nevi of left upper limb, including shoulder: Secondary | ICD-10-CM | POA: Diagnosis not present

## 2018-07-16 DIAGNOSIS — Z08 Encounter for follow-up examination after completed treatment for malignant neoplasm: Secondary | ICD-10-CM | POA: Diagnosis not present

## 2018-07-16 DIAGNOSIS — L570 Actinic keratosis: Secondary | ICD-10-CM | POA: Diagnosis not present

## 2018-07-16 DIAGNOSIS — D2272 Melanocytic nevi of left lower limb, including hip: Secondary | ICD-10-CM | POA: Diagnosis not present

## 2018-07-16 DIAGNOSIS — X32XXXA Exposure to sunlight, initial encounter: Secondary | ICD-10-CM | POA: Diagnosis not present

## 2018-07-16 DIAGNOSIS — L299 Pruritus, unspecified: Secondary | ICD-10-CM | POA: Diagnosis not present

## 2018-07-16 DIAGNOSIS — Z85828 Personal history of other malignant neoplasm of skin: Secondary | ICD-10-CM | POA: Diagnosis not present

## 2018-07-16 DIAGNOSIS — D2261 Melanocytic nevi of right upper limb, including shoulder: Secondary | ICD-10-CM | POA: Diagnosis not present

## 2018-09-26 DIAGNOSIS — G4733 Obstructive sleep apnea (adult) (pediatric): Secondary | ICD-10-CM | POA: Diagnosis not present

## 2018-09-26 DIAGNOSIS — J4520 Mild intermittent asthma, uncomplicated: Secondary | ICD-10-CM | POA: Diagnosis not present

## 2018-10-09 ENCOUNTER — Other Ambulatory Visit: Payer: Self-pay

## 2018-10-09 ENCOUNTER — Ambulatory Visit (INDEPENDENT_AMBULATORY_CARE_PROVIDER_SITE_OTHER): Payer: Medicare Other | Admitting: Family Medicine

## 2018-10-09 ENCOUNTER — Encounter: Payer: Self-pay | Admitting: Family Medicine

## 2018-10-09 DIAGNOSIS — I10 Essential (primary) hypertension: Secondary | ICD-10-CM

## 2018-10-09 DIAGNOSIS — J309 Allergic rhinitis, unspecified: Secondary | ICD-10-CM

## 2018-10-09 DIAGNOSIS — J42 Unspecified chronic bronchitis: Secondary | ICD-10-CM | POA: Diagnosis not present

## 2018-10-09 NOTE — Assessment & Plan Note (Signed)
The current medical regimen is effective;  continue present plan and medications.  

## 2018-10-09 NOTE — Progress Notes (Signed)
There were no vitals taken for this visit.   Subjective:    Patient ID: Carl Fernandez, male    DOB: [birth year], 78 y.o.   MRN: [id number]  HPI: Carl Fernandez is a 78 y.o. male  shoulder pain   Telemedicine using audio/video telecommunications for a synchronous communication visit. Today's visit due to COVID-19 isolation precautions I connected with and verified that I am speaking with the correct person using two identifiers.   I discussed the limitations, risks, security and privacy concerns of performing an evaluation and management service by telecommunication and the availability of in person appointments. I also discussed with the patient that there may be a patient responsible charge related to this service. The patient expressed understanding and agreed to proceed. The patient's location is home. I am at home.  Discussed with patient has been doing a lot of shoulder work taking down her old swingset and doing overhead work now his right shoulder is hurting.  Tylenol and heat pads seem to help.  This been ongoing about a week. Otherwise doing well with blood pressure COPD sleep apnea taking medication without problems or issues has refills for allergy medicines and doing well.   Relevant past medical, surgical, family and social history reviewed and updated as indicated. Interim medical history since our last visit reviewed. Allergies and medications reviewed and updated.  Review of Systems  Constitutional: Negative.   Respiratory: Negative.   Cardiovascular: Negative.     Per HPI unless specifically indicated above     Objective:    There were no vitals taken for this visit.  Wt Readings from Last 3 Encounters:  03/27/18 194 lb 9.6 oz (88.3 kg)  03/22/18 195 lb (88.5 kg)  09/13/17 195 lb 11.2 oz (88.8 kg)    Physical Exam  Results for orders placed or performed in visit on 03/27/18  CBC with Differential/Platelet  Result Value Ref Range   WBC 7.4 3.4 - 10.8  x10E3/uL   RBC 5.07 4.14 - 5.80 x10E6/uL   Hemoglobin 14.9 13.0 - 17.7 g/dL   Hematocrit 44.5 37.5 - 51.0 %   MCV 88 79 - 97 fL   MCH 29.4 26.6 - 33.0 pg   MCHC 33.5 31.5 - 35.7 g/dL   RDW 12.4 12.3 - 15.4 %   Platelets 211 150 - 450 x10E3/uL   Neutrophils 78 Not Estab. %   Lymphs 13 Not Estab. %   Monocytes 7 Not Estab. %   Eos 2 Not Estab. %   Basos 0 Not Estab. %   Neutrophils Absolute 5.7 1.4 - 7.0 x10E3/uL   Lymphocytes Absolute 1.0 0.7 - 3.1 x10E3/uL   Monocytes Absolute 0.5 0.1 - 0.9 x10E3/uL   EOS (ABSOLUTE) 0.1 0.0 - 0.4 x10E3/uL   Basophils Absolute 0.0 0.0 - 0.2 x10E3/uL   Immature Granulocytes 0 Not Estab. %   Immature Grans (Abs) 0.0 0.0 - 0.1 x10E3/uL  Comprehensive metabolic panel  Result Value Ref Range   Glucose 88 65 - 99 mg/dL   BUN 13 8 - 27 mg/dL   Creatinine, Ser 1.22 0.76 - 1.27 mg/dL   GFR calc non Af Amer 57 (L) >59 mL/min/1.73   GFR calc Af Amer 66 >59 mL/min/1.73   BUN/Creatinine Ratio 11 10 - 24   Sodium 143 134 - 144 mmol/L   Potassium 4.0 3.5 - 5.2 mmol/L   Chloride 105 96 - 106 mmol/L   CO2 23 20 - 29 mmol/L   Calcium 9.4 8.6 - 10.2  mg/dL   Total Protein 6.7 6.0 - 8.5 g/dL   Albumin 4.4 3.5 - 4.8 g/dL   Globulin, Total 2.3 1.5 - 4.5 g/dL   Albumin/Globulin Ratio 1.9 1.2 - 2.2   Bilirubin Total 0.6 0.0 - 1.2 mg/dL   Alkaline Phosphatase 68 39 - 117 IU/L   AST 12 0 - 40 IU/L   ALT 12 0 - 44 IU/L  TSH  Result Value Ref Range   TSH 2.200 0.450 - 4.500 uIU/mL  Lipid panel  Result Value Ref Range   Cholesterol, Total 206 (H) 100 - 199 mg/dL   Triglycerides 151 (H) 0 - 149 mg/dL   HDL 37 (L) >39 mg/dL   VLDL Cholesterol Cal 30 5 - 40 mg/dL   LDL Calculated 139 (H) 0 - 99 mg/dL   Chol/HDL Ratio 5.6 (H) 0.0 - 5.0 ratio  Urinalysis, Routine w reflex microscopic  Result Value Ref Range   Specific Gravity, UA 1.015 1.005 - 1.030   pH, UA 6.5 5.0 - 7.5   Color, UA Yellow Yellow   Appearance Ur Clear Clear   Leukocytes, UA Negative Negative    Protein, UA Negative Negative/Trace   Glucose, UA Negative Negative   Ketones, UA Negative Negative   RBC, UA Negative Negative   Bilirubin, UA Negative Negative   Urobilinogen, Ur 0.2 0.2 - 1.0 mg/dL   Nitrite, UA Negative Negative      Assessment & Plan:   Problem List Items Addressed This Visit      Cardiovascular and Mediastinum   Essential hypertension    The current medical regimen is effective;  continue present plan and medications.         Respiratory   COPD (chronic obstructive pulmonary disease) (HCC)    The current medical regimen is effective;  continue present plan and medications.       Allergic rhinitis    The current medical regimen is effective;  continue present plan and medications.        Discussed shoulder care and treatment use of over-the-counter medications  I discussed the assessment and treatment plan with the patient. The patient was provided an opportunity to ask questions and all were answered. The patient agreed with the plan and demonstrated an understanding of the instructions.   The patient was advised to call back or seek an in-person evaluation if the symptoms worsen or if the condition fails to improve as anticipated.   I provided 21+ minutes of time during this encounter. Follow up plan: Return in about 6 months (around 04/10/2019) for Physical Exam.

## 2018-12-27 DIAGNOSIS — J310 Chronic rhinitis: Secondary | ICD-10-CM | POA: Diagnosis not present

## 2018-12-27 DIAGNOSIS — G4733 Obstructive sleep apnea (adult) (pediatric): Secondary | ICD-10-CM | POA: Diagnosis not present

## 2018-12-27 DIAGNOSIS — J4520 Mild intermittent asthma, uncomplicated: Secondary | ICD-10-CM | POA: Diagnosis not present

## 2019-01-09 DIAGNOSIS — Z23 Encounter for immunization: Secondary | ICD-10-CM | POA: Diagnosis not present

## 2019-01-11 DIAGNOSIS — H2513 Age-related nuclear cataract, bilateral: Secondary | ICD-10-CM | POA: Diagnosis not present

## 2019-03-22 ENCOUNTER — Other Ambulatory Visit: Payer: Self-pay

## 2019-03-25 ENCOUNTER — Ambulatory Visit (INDEPENDENT_AMBULATORY_CARE_PROVIDER_SITE_OTHER): Payer: Medicare Other

## 2019-03-25 ENCOUNTER — Ambulatory Visit (INDEPENDENT_AMBULATORY_CARE_PROVIDER_SITE_OTHER): Payer: Medicare Other | Admitting: Family Medicine

## 2019-03-25 ENCOUNTER — Encounter: Payer: Self-pay | Admitting: Family Medicine

## 2019-03-25 ENCOUNTER — Other Ambulatory Visit: Payer: Self-pay

## 2019-03-25 VITALS — BP 142/84 | HR 78 | Temp 97.8°F | Ht 69.0 in | Wt 197.6 lb

## 2019-03-25 VITALS — BP 137/75 | HR 78 | Temp 97.8°F | Ht 69.0 in | Wt 197.0 lb

## 2019-03-25 DIAGNOSIS — I10 Essential (primary) hypertension: Secondary | ICD-10-CM | POA: Diagnosis not present

## 2019-03-25 DIAGNOSIS — J309 Allergic rhinitis, unspecified: Secondary | ICD-10-CM | POA: Diagnosis not present

## 2019-03-25 DIAGNOSIS — E7800 Pure hypercholesterolemia, unspecified: Secondary | ICD-10-CM

## 2019-03-25 DIAGNOSIS — Z0000 Encounter for general adult medical examination without abnormal findings: Secondary | ICD-10-CM

## 2019-03-25 DIAGNOSIS — J42 Unspecified chronic bronchitis: Secondary | ICD-10-CM | POA: Diagnosis not present

## 2019-03-25 DIAGNOSIS — K219 Gastro-esophageal reflux disease without esophagitis: Secondary | ICD-10-CM | POA: Diagnosis not present

## 2019-03-25 LAB — MICROSCOPIC EXAMINATION
Bacteria, UA: NONE SEEN
WBC, UA: NONE SEEN /hpf (ref 0–5)

## 2019-03-25 LAB — UA/M W/RFLX CULTURE, ROUTINE
Bilirubin, UA: NEGATIVE
Glucose, UA: NEGATIVE
Ketones, UA: NEGATIVE
Leukocytes,UA: NEGATIVE
Nitrite, UA: NEGATIVE
Protein,UA: NEGATIVE
Specific Gravity, UA: 1.025 (ref 1.005–1.030)
Urobilinogen, Ur: 0.2 mg/dL (ref 0.2–1.0)
pH, UA: 7 (ref 5.0–7.5)

## 2019-03-25 MED ORDER — FLUTICASONE PROPIONATE 50 MCG/ACT NA SUSP: 2.0000 | Freq: Every day | NASAL | 12 refills | Status: DC

## 2019-03-25 MED ORDER — ALBUTEROL SULFATE HFA 108 (90 BASE) MCG/ACT IN AERS: 2.0000 | INHALATION_SPRAY | Freq: Four times a day (QID) | RESPIRATORY_TRACT | 0 refills | Status: DC | PRN

## 2019-03-25 MED ORDER — BENAZEPRIL HCL 40 MG PO TABS: 40.0000 mg | ORAL_TABLET | Freq: Every day | ORAL | 1 refills | Status: DC

## 2019-03-25 MED ORDER — MONTELUKAST SODIUM 10 MG PO TABS: 10.0000 mg | ORAL_TABLET | Freq: Every day | ORAL | 1 refills | Status: DC

## 2019-03-25 NOTE — Progress Notes (Signed)
BP 137/75   Pulse 78   Temp 97.8 F (36.6 C) (Temporal)   Ht 5\' 9"  (1.753 m)   Wt 197 lb (89.4 kg)   BMI 29.09 kg/m    Subjective:    Patient ID: Carl Fernandez, male    DOB: [birth year], 78 y.o.   MRN: [id number]  HPI: Carl Fernandez is a 77 y.o. male  Chief Complaint  Patient presents with  . Hyperlipidemia  . Hypertension   Patient here today for 6 month f/u chronic conditions. Taking his medicines faithfully without side effects. Denies Cp, SOB, HAs, dizziness, claudication, myalgias. Trying to eat well and stay active. Has not been checking home BPs regularly, but 130s/70s when checked. HLD diet controlled. GERD stable without breakthrough sxs on prilosec. COPD well controlled with advair and albuterol regimen. No recent flares. Allergies on singulair, antihistamine and flonase regimen which seems to be going very well.   Relevant past medical, surgical, family and social history reviewed and updated as indicated. Interim medical history since our last visit reviewed. Allergies and medications reviewed and updated.  Review of Systems  Per HPI unless specifically indicated above     Objective:    BP 137/75   Pulse 78   Temp 97.8 F (36.6 C) (Temporal)   Ht 5\' 9"  (1.753 m)   Wt 197 lb (89.4 kg)   BMI 29.09 kg/m   Wt Readings from Last 3 Encounters:  03/25/19 197 lb (89.4 kg)  03/25/19 197 lb 9.6 oz (89.6 kg)  03/27/18 194 lb 9.6 oz (88.3 kg)    Physical Exam Vitals signs and nursing note reviewed.  Constitutional:      Appearance: Normal appearance.  HENT:     Head: Atraumatic.  Eyes:     Extraocular Movements: Extraocular movements intact.     Conjunctiva/sclera: Conjunctivae normal.  Neck:     Musculoskeletal: Normal range of motion and neck supple.  Cardiovascular:     Rate and Rhythm: Normal rate and regular rhythm.  Pulmonary:     Effort: Pulmonary effort is normal.     Breath sounds: Normal breath sounds.  Musculoskeletal: Normal range of motion.   Skin:    General: Skin is warm and dry.  Neurological:     General: No focal deficit present.     Mental Status: He is oriented to person, place, and time.  Psychiatric:        Mood and Affect: Mood normal.        Thought Content: Thought content normal.        Judgment: Judgment normal.     Results for orders placed or performed in visit on 03/25/19  Microscopic Examination   URINE  Result Value Ref Range   WBC, UA None seen 0 - 5 /hpf   RBC 3-10 (A) 0 - 2 /hpf   Epithelial Cells (non renal) 0-10 0 - 10 /hpf   Bacteria, UA None seen None seen/Few  CBC with Differential/Platelet out  Result Value Ref Range   WBC 6.2 3.4 - 10.8 x10E3/uL   RBC 4.83 4.14 - 5.80 x10E6/uL   Hemoglobin 14.2 13.0 - 17.7 g/dL   Hematocrit 42.7 37.5 - 51.0 %   MCV 88 79 - 97 fL   MCH 29.4 26.6 - 33.0 pg   MCHC 33.3 31.5 - 35.7 g/dL   RDW 12.4 11.6 - 15.4 %   Platelets 206 150 - 450 x10E3/uL   Neutrophils 72 Not Estab. %   Lymphs 17 Not Estab. %  Monocytes 8 Not Estab. %   Eos 2 Not Estab. %   Basos 1 Not Estab. %   Neutrophils Absolute 4.4 1.4 - 7.0 x10E3/uL   Lymphocytes Absolute 1.1 0.7 - 3.1 x10E3/uL   Monocytes Absolute 0.5 0.1 - 0.9 x10E3/uL   EOS (ABSOLUTE) 0.2 0.0 - 0.4 x10E3/uL   Basophils Absolute 0.0 0.0 - 0.2 x10E3/uL   Immature Granulocytes 0 Not Estab. %   Immature Grans (Abs) 0.0 0.0 - 0.1 x10E3/uL  Comprehensive metabolic panel  Result Value Ref Range   Glucose 90 65 - 99 mg/dL   BUN 13 8 - 27 mg/dL   Creatinine, Ser 1.19 0.76 - 1.27 mg/dL   GFR calc non Af Amer 58 (L) >59 mL/min/1.73   GFR calc Af Amer 67 >59 mL/min/1.73   BUN/Creatinine Ratio 11 10 - 24   Sodium 143 134 - 144 mmol/L   Potassium 4.5 3.5 - 5.2 mmol/L   Chloride 105 96 - 106 mmol/L   CO2 24 20 - 29 mmol/L   Calcium 8.9 8.6 - 10.2 mg/dL   Total Protein 6.4 6.0 - 8.5 g/dL   Albumin 4.1 3.7 - 4.7 g/dL   Globulin, Total 2.3 1.5 - 4.5 g/dL   Albumin/Globulin Ratio 1.8 1.2 - 2.2   Bilirubin Total 0.5 0.0 -  1.2 mg/dL   Alkaline Phosphatase 70 39 - 117 IU/L   AST 14 0 - 40 IU/L   ALT 7 0 - 44 IU/L  UA/M w/rflx Culture, Routine   Specimen: Urine   URINE  Result Value Ref Range   Specific Gravity, UA 1.025 1.005 - 1.030   pH, UA 7.0 5.0 - 7.5   Color, UA Yellow Yellow   Appearance Ur Clear Clear   Leukocytes,UA Negative Negative   Protein,UA Negative Negative/Trace   Glucose, UA Negative Negative   Ketones, UA Negative Negative   RBC, UA Trace (A) Negative   Bilirubin, UA Negative Negative   Urobilinogen, Ur 0.2 0.2 - 1.0 mg/dL   Nitrite, UA Negative Negative   Microscopic Examination See below:   TSH  Result Value Ref Range   TSH 1.940 0.450 - 4.500 uIU/mL      Assessment & Plan:   Problem List Items Addressed This Visit      Cardiovascular and Mediastinum   Essential hypertension    BPs stable and WNL, continue current regimen      Relevant Medications   benazepril (LOTENSIN) 40 MG tablet   Other Relevant Orders   CBC with Differential/Platelet out (Completed)   Comprehensive metabolic panel (Completed)   UA/M w/rflx Culture, Routine (Completed)   TSH (Completed)     Respiratory   COPD (chronic obstructive pulmonary disease) (HCC)    Stable and under good control, continue current inhaler regimen      Relevant Medications   fluticasone (FLONASE) 50 MCG/ACT nasal spray   montelukast (SINGULAIR) 10 MG tablet   albuterol (PROAIR HFA) 108 (90 Base) MCG/ACT inhaler   Allergic rhinitis    Under good control, continue current allergy regimen      Relevant Medications   fluticasone (FLONASE) 50 MCG/ACT nasal spray     Digestive   GERD (gastroesophageal reflux disease) - Primary    prilosec controlling reflux sxs well. Continue current regimen        Other   Hypercholesteremia    Diet controlled, continue to monitor and watch diet and activity levels      Relevant Medications   benazepril (LOTENSIN) 40 MG  tablet       Follow up plan: Return in about 6  months (around 09/22/2019) for 6 month f/u.

## 2019-03-25 NOTE — Progress Notes (Signed)
Subjective:   Carl Fernandez is a 78 y.o. male who presents for Medicare Annual/Subsequent preventive examination.  Review of Systems:  Cardiac Risk Factors include: advanced age (>56men, >82 women);hypertension;male gender     Objective:    Vitals: BP (!) 142/84   Pulse 78   Temp 97.8 F (36.6 C) (Temporal)   Ht 5\' 9"  (1.753 m)   Wt 197 lb 9.6 oz (89.6 kg)   BMI 29.18 kg/m   Body mass index is 29.18 kg/m.  Advanced Directives 03/25/2019 03/22/2018 01/26/2017  Does Patient Have a Medical Advance Directive? Yes Yes Yes  Type of Advance Directive Living will;Healthcare Power of [place];Living will [place];Living will  Does patient want to make changes to medical advance directive? - No - Patient declined -  Copy of [place] in Chart? No - copy requested No - copy requested No - copy requested    Tobacco Social History   Tobacco Use  Smoking Status Never Smoker  Smokeless Tobacco Never Used     Counseling given: Not Answered   Clinical Intake:  Pre-visit preparation completed: Yes  Pain : No/denies pain     Nutritional Risks: None Diabetes: No  How often do you need to have someone help you when you read instructions, pamphlets, or other written materials from your doctor or pharmacy?: 1 - Never  Interpreter Needed?: No  Information entered by ::  ,LPN  Past Medical History:  Diagnosis Date  . COPD (chronic obstructive pulmonary disease) ([place])   . Diverticulitis   . GERD (gastroesophageal reflux disease)   . Hard of hearing   . Kidney stones   . OSA on CPAP   . Plantar fasciitis    Past Surgical History:  Procedure Laterality Date  . LITHOTRIPSY    . nasoplasty     Family History  Problem Relation Age of Onset  . Hypertension Mother   . Osteoporosis Mother   . Cancer Mother        skin on her nose  . Heart disease Father    Social History   Socioeconomic  History  . Marital status: Married    Spouse name: Not on file  . Number of children: Not on file  . Years of education: Not on file  . Highest education level: Not on file  Occupational History  . Not on file  Social Needs  . Financial resource strain: Not hard at all  . Food insecurity    Worry: Never true    Inability: Never true  . Transportation needs    Medical: No    Non-medical: No  Tobacco Use  . Smoking status: Never Smoker  . Smokeless tobacco: Never Used  Substance and Sexual Activity  . Alcohol use: No  . Drug use: No  . Sexual activity: Not on file  Lifestyle  . Physical activity    Days per week: 0 days    Minutes per session: 0 min  . Stress: Not at all  Relationships  . Social Herbalist on phone: Three times a week    Gets together: Three times a week    Attends religious service: More than 4 times per year    Active member of club or organization: No    Attends meetings of clubs or organizations: Never    Relationship status: Married  Other Topics Concern  . Not on file  Social History Narrative  . Not  on file    Outpatient Encounter Medications as of 03/25/2019  Medication Sig  . benazepril (LOTENSIN) 40 MG tablet Take 1 tablet (40 mg total) by mouth daily.  . fluticasone (FLONASE) 50 MCG/ACT nasal spray Place 2 sprays into both nostrils daily.  . fluticasone-salmeterol (ADVAIR HFA) 115-21 MCG/ACT inhaler Inhale 2 inhalations into the lungs every 12 (twelve) hours  . loratadine (CLARITIN) 10 MG tablet Take 1 tablet (10 mg total) by mouth once daily.  . montelukast (SINGULAIR) 10 MG tablet Take 1 tablet (10 mg total) by mouth at bedtime.  Marland Kitchen omeprazole (PRILOSEC) 40 MG capsule Take 1 capsule (40 mg total) by mouth daily.  Marland Kitchen albuterol (PROAIR HFA) 108 (90 Base) MCG/ACT inhaler Inhale 2 puffs into the lungs every 6 (six) hours as needed for wheezing or shortness of breath. (Patient not taking: Reported on 03/25/2019)  . [DISCONTINUED] DENTA  5000 PLUS 1.1 % CREA dental cream    No facility-administered encounter medications on file as of 03/25/2019.     Activities of Daily Living In your present state of health, do you have any difficulty performing the following activities: 03/25/2019  Hearing? Y  Comment hearing aids bilateral  Vision? N  Comment eyeglasses, goes to [facility]  Difficulty concentrating or making decisions? N  Walking or climbing stairs? N  Dressing or bathing? N  Doing errands, shopping? N  Preparing Food and eating ? N  Using the Toilet? N  In the past six months, have you accidently leaked urine? N  Do you have problems with loss of bowel control? N  Managing your Medications? N  Managing your Finances? N  Housekeeping or managing your Housekeeping? N  Some recent data might be hidden    Patient Care Team: Guadalupe Maple, MD as PCP - General (Family Medicine) Erby Pian, MD as Referring Physician (Specialist) Dasher, Rayvon Char, MD (Dermatology) Manya Silvas, MD (Inactive) (Gastroenterology)   Assessment:   This is a routine wellness examination for Carl Fernandez.  Exercise Activities and Dietary recommendations Current Exercise Habits: The patient does not participate in regular exercise at present, Exercise limited by: None identified  Goals   None     Fall Risk: Fall Risk  03/25/2019 03/22/2018 09/13/2017 03/08/2017 01/31/2017  Falls in the past year? 0 0 No No No  Number falls in past yr: 0 0 - - -  Injury with Fall? 0 0 - - -    FALL RISK PREVENTION PERTAINING TO THE HOME:  Any stairs in or around the home? Yes  If so, are there any without handrails? No   Home free of loose throw rugs in walkways, pet beds, electrical cords, etc? Yes  Adequate lighting in your home to reduce risk of falls? Yes   ASSISTIVE DEVICES UTILIZED TO PREVENT FALLS:  Life alert? No  Use of a cane, walker or w/c? No  Grab bars in the bathroom? Yes  Shower chair or bench in shower? No   Elevated toilet seat or a handicapped toilet? No   TIMED UP AND GO:  Was the test performed? Yes .  Length of time to ambulate 10 feet: 9 sec.   GAIT:  Appearance of gait: Gait steady and fast without the use of an assistive device. Education: Fall risk prevention has been discussed.  Intervention(s) required? No  DME/home health order needed?  No   Depression Screen PHQ 2/9 Scores 03/25/2019 03/22/2018 09/13/2017 03/08/2017  PHQ - 2 Score 0 0 0 0  Cognitive Function     6CIT Screen 03/25/2019 03/22/2018 01/26/2017  What Year? 0 points 0 points 0 points  What month? 0 points 0 points 0 points  What time? 0 points 0 points 0 points  Count back from 20 0 points 0 points 0 points  Months in reverse 0 points 0 points 0 points  Repeat phrase 0 points 0 points 0 points  Total Score 0 0 0    Immunization History  Administered Date(s) Administered  . Influenza, High Dose Seasonal PF 01/26/2016, 01/26/2017, 03/22/2018  . Influenza,inj,Quad PF,6+ Mos 04/29/2015  . Influenza-Unspecified 01/07/2014, 01/10/2019  . Pneumococcal Conjugate-13 04/29/2015  . Pneumococcal Polysaccharide-23 01/26/2017  . Tdap 10/09/2013  . Zoster Recombinat (Shingrix) 06/21/2018, 10/09/2018    Qualifies for Shingles Vaccine? Completed shingrix   Tdap:up to date  Flu Vaccine: up to date   Pneumococcal Vaccine: up to date   Screening Tests Health Maintenance  Topic Date Due  . TETANUS/TDAP  10/10/2023  . INFLUENZA VACCINE  Completed  . PNA vac Low Risk Adult  Completed   Cancer Screenings:  Colorectal Screening: no longer required   Lung Cancer Screening: (Low Dose CT Chest recommended if Age 82-80 years, 30 pack-year currently smoking OR have quit w/in 15years.) does not qualify.    Additional Screening:  Hepatitis C Screening: does not qualify  Vision Screening: Recommended annual ophthalmology exams for early detection of glaucoma and other disorders of the eye. Is the patient up to  date with their annual eye exam?  Yes  Who is the provider or what is the name of the office in which the pt attends annual eye exams?  Dental Screening: Recommended annual dental exams for proper oral hygiene  Community Resource Referral:  CRR required this visit?  No        Plan:  I have personally reviewed and addressed the Medicare Annual Wellness questionnaire and have noted the following in the patient's chart:  A. Medical and social history B. Use of alcohol, tobacco or illicit drugs  C. Current medications and supplements D. Functional ability and status E.  Nutritional status F.  Physical activity G. Advance directives H. List of other physicians I.  Hospitalizations, surgeries, and ER visits in previous 12 months J.  [place] such as hearing and vision if needed, cognitive and depression L. Referrals and appointments   In addition, I have reviewed and discussed with patient certain preventive protocols, quality metrics, and best practice recommendations. A written personalized care plan for preventive services as well as general preventive health recommendations were provided to patient.   Signed,   Bevelyn Ngo, LPN  [id number] Nurse Health Advisor   Nurse Notes: none

## 2019-03-25 NOTE — Patient Instructions (Signed)
Carl Fernandez , Thank you for taking time to come for your Medicare Wellness Visit. I appreciate your ongoing commitment to your health goals. Please review the following plan we discussed and let me know if I can assist you in the future.   Screening recommendations/referrals: Colonoscopy: no longer required  Recommended yearly ophthalmology/optometry visit for glaucoma screening and checkup Recommended yearly dental visit for hygiene and checkup  Vaccinations: Influenza vaccine: up to date Pneumococcal vaccine: up to date Tdap vaccine: up to date Shingles vaccine: up to date    Advanced directives: Please bring a copy of your health care power of attorney and living will to the office at your convenience.  Conditions/risks identified: none   Next appointment: Follow up in one year for your annual wellness visit   Preventive Care 65 Years and Older, Male Preventive care refers to lifestyle choices and visits with your health care provider that can promote health and wellness. What does preventive care include?  A yearly physical exam. This is also called an annual well check.  Dental exams once or twice a year.  Routine eye exams. Ask your health care provider how often you should have your eyes checked.  Personal lifestyle choices, including:  Daily care of your teeth and gums.  Regular physical activity.  Eating a healthy diet.  Avoiding tobacco and drug use.  Limiting alcohol use.  Practicing safe sex.  Taking low doses of aspirin every day.  Taking vitamin and mineral supplements as recommended by your health care provider. What happens during an annual well check? The services and screenings done by your health care provider during your annual well check will depend on your age, overall health, lifestyle risk factors, and family history of disease. Counseling  Your health care provider may ask you questions about your:  Alcohol use.  Tobacco use.  Drug  use.  Emotional well-being.  Home and relationship well-being.  Sexual activity.  Eating habits.  History of falls.  Memory and ability to understand (cognition).  Work and work Statistician. Screening  You may have the following tests or measurements:  Height, weight, and BMI.  Blood pressure.  Lipid and cholesterol levels. These may be checked every 5 years, or more frequently if you are over 39 years old.  Skin check.  Lung cancer screening. You may have this screening every year starting at age 1 if you have a 30-pack-year history of smoking and currently smoke or have quit within the past 15 years.  Fecal occult blood test (FOBT) of the stool. You may have this test every year starting at age 31.  Flexible sigmoidoscopy or colonoscopy. You may have a sigmoidoscopy every 5 years or a colonoscopy every 10 years starting at age 86.  Prostate cancer screening. Recommendations will vary depending on your family history and other risks.  Hepatitis C blood test.  Hepatitis B blood test.  Sexually transmitted disease (STD) testing.  Diabetes screening. This is done by checking your blood sugar (glucose) after you have not eaten for a while (fasting). You may have this done every 1-3 years.  Abdominal aortic aneurysm (AAA) screening. You may need this if you are a current or former smoker.  Osteoporosis. You may be screened starting at age 71 if you are at high risk. Talk with your health care provider about your test results, treatment options, and if necessary, the need for more tests. Vaccines  Your health care provider may recommend certain vaccines, such as:  Influenza  vaccine. This is recommended every year.  Tetanus, diphtheria, and acellular pertussis (Tdap, Td) vaccine. You may need a Td booster every 10 years.  Zoster vaccine. You may need this after age 85.  Pneumococcal 13-valent conjugate (PCV13) vaccine. One dose is recommended after age 85.   Pneumococcal polysaccharide (PPSV23) vaccine. One dose is recommended after age 70. Talk to your health care provider about which screenings and vaccines you need and how often you need them. This information is not intended to replace advice given to you by your health care provider. Make sure you discuss any questions you have with your health care provider. Document Released: 05/22/2015 Document Revised: 01/13/2016 Document Reviewed: 02/24/2015 Elsevier Interactive Patient Education  2017 [employer] Prevention in the Home Falls can cause injuries. They can happen to people of all ages. There are many things you can do to make your home safe and to help prevent falls. What can I do on the outside of my home?  Regularly fix the edges of walkways and driveways and fix any cracks.  Remove anything that might make you trip as you walk through a door, such as a raised step or threshold.  Trim any bushes or trees on the path to your home.  Use bright outdoor lighting.  Clear any walking paths of anything that might make someone trip, such as rocks or tools.  Regularly check to see if handrails are loose or broken. Make sure that both sides of any steps have handrails.  Any raised decks and porches should have guardrails on the edges.  Have any leaves, snow, or ice cleared regularly.  Use sand or salt on walking paths during winter.  Clean up any spills in your garage right away. This includes oil or grease spills. What can I do in the bathroom?  Use night lights.  Install grab bars by the toilet and in the tub and shower. Do not use towel bars as grab bars.  Use non-skid mats or decals in the tub or shower.  If you need to sit down in the shower, use a plastic, non-slip stool.  Keep the floor dry. Clean up any water that spills on the floor as soon as it happens.  Remove soap buildup in the tub or shower regularly.  Attach bath mats securely with double-sided non-slip  rug tape.  Do not have throw rugs and other things on the floor that can make you trip. What can I do in the bedroom?  Use night lights.  Make sure that you have a light by your bed that is easy to reach.  Do not use any sheets or blankets that are too big for your bed. They should not hang down onto the floor.  Have a firm chair that has side arms. You can use this for support while you get dressed.  Do not have throw rugs and other things on the floor that can make you trip. What can I do in the kitchen?  Clean up any spills right away.  Avoid walking on wet floors.  Keep items that you use a lot in easy-to-reach places.  If you need to reach something above you, use a strong step stool that has a grab bar.  Keep electrical cords out of the way.  Do not use floor polish or wax that makes floors slippery. If you must use wax, use non-skid floor wax.  Do not have throw rugs and other things on the floor that can  make you trip. What can I do with my stairs?  Do not leave any items on the stairs.  Make sure that there are handrails on both sides of the stairs and use them. Fix handrails that are broken or loose. Make sure that handrails are as long as the stairways.  Check any carpeting to make sure that it is firmly attached to the stairs. Fix any carpet that is loose or worn.  Avoid having throw rugs at the top or bottom of the stairs. If you do have throw rugs, attach them to the floor with carpet tape.  Make sure that you have a light switch at the top of the stairs and the bottom of the stairs. If you do not have them, ask someone to add them for you. What else can I do to help prevent falls?  Wear shoes that:  Do not have high heels.  Have rubber bottoms.  Are comfortable and fit you well.  Are closed at the toe. Do not wear sandals.  If you use a stepladder:  Make sure that it is fully opened. Do not climb a closed stepladder.  Make sure that both sides of  the stepladder are locked into place.  Ask someone to hold it for you, if possible.  Clearly mark and make sure that you can see:  Any grab bars or handrails.  First and last steps.  Where the edge of each step is.  Use tools that help you move around (mobility aids) if they are needed. These include:  Canes.  Walkers.  Scooters.  Crutches.  Turn on the lights when you go into a dark area. Replace any light bulbs as soon as they burn out.  Set up your furniture so you have a clear path. Avoid moving your furniture around.  If any of your floors are uneven, fix them.  If there are any pets around you, be aware of where they are.  Review your medicines with your doctor. Some medicines can make you feel dizzy. This can increase your chance of falling. Ask your doctor what other things that you can do to help prevent falls. This information is not intended to replace advice given to you by your health care provider. Make sure you discuss any questions you have with your health care provider. Document Released: 02/19/2009 Document Revised: 10/01/2015 Document Reviewed: 05/30/2014 Elsevier Interactive Patient Education  2017 [employer].

## 2019-03-26 LAB — CBC WITH DIFFERENTIAL/PLATELET
Basophils Absolute: 0 10*3/uL (ref 0.0–0.2)
Basos: 1 %
EOS (ABSOLUTE): 0.2 10*3/uL (ref 0.0–0.4)
Eos: 2 %
Hematocrit: 42.7 % (ref 37.5–51.0)
Hemoglobin: 14.2 g/dL (ref 13.0–17.7)
Immature Grans (Abs): 0 10*3/uL (ref 0.0–0.1)
Immature Granulocytes: 0 %
Lymphocytes Absolute: 1.1 10*3/uL (ref 0.7–3.1)
Lymphs: 17 %
MCH: 29.4 pg (ref 26.6–33.0)
MCHC: 33.3 g/dL (ref 31.5–35.7)
MCV: 88 fL (ref 79–97)
Monocytes Absolute: 0.5 10*3/uL (ref 0.1–0.9)
Monocytes: 8 %
Neutrophils Absolute: 4.4 10*3/uL (ref 1.4–7.0)
Neutrophils: 72 %
Platelets: 206 10*3/uL (ref 150–450)
RBC: 4.83 x10E6/uL (ref 4.14–5.80)
RDW: 12.4 % (ref 11.6–15.4)
WBC: 6.2 10*3/uL (ref 3.4–10.8)

## 2019-03-26 LAB — COMPREHENSIVE METABOLIC PANEL
ALT: 7 IU/L (ref 0–44)
AST: 14 IU/L (ref 0–40)
Albumin/Globulin Ratio: 1.8 (ref 1.2–2.2)
Albumin: 4.1 g/dL (ref 3.7–4.7)
Alkaline Phosphatase: 70 IU/L (ref 39–117)
BUN/Creatinine Ratio: 11 (ref 10–24)
BUN: 13 mg/dL (ref 8–27)
Bilirubin Total: 0.5 mg/dL (ref 0.0–1.2)
CO2: 24 mmol/L (ref 20–29)
Calcium: 8.9 mg/dL (ref 8.6–10.2)
Chloride: 105 mmol/L (ref 96–106)
Creatinine, Ser: 1.19 mg/dL (ref 0.76–1.27)
GFR calc Af Amer: 67 mL/min/{1.73_m2} (ref >59)
GFR calc non Af Amer: 58 mL/min/{1.73_m2} — ABNORMAL LOW (ref >59)
Globulin, Total: 2.3 g/dL (ref 1.5–4.5)
Glucose: 90 mg/dL (ref 65–99)
Potassium: 4.5 mmol/L (ref 3.5–5.2)
Sodium: 143 mmol/L (ref 134–144)
Total Protein: 6.4 g/dL (ref 6.0–8.5)

## 2019-03-26 LAB — TSH: TSH: 1.94 u[IU]/mL (ref 0.450–4.500)

## 2019-03-28 NOTE — Assessment & Plan Note (Signed)
Diet controlled, continue to monitor and watch diet and activity levels

## 2019-03-28 NOTE — Assessment & Plan Note (Signed)
Under good control, continue current allergy regimen

## 2019-03-28 NOTE — Assessment & Plan Note (Signed)
BPs stable and WNL, continue current regimen 

## 2019-03-28 NOTE — Assessment & Plan Note (Signed)
Stable and under good control, continue current inhaler regimen

## 2019-03-28 NOTE — Assessment & Plan Note (Signed)
prilosec controlling reflux sxs well. Continue current regimen

## 2019-04-16 ENCOUNTER — Other Ambulatory Visit: Payer: Self-pay | Admitting: Family Medicine

## 2019-04-16 NOTE — Telephone Encounter (Signed)
Requested medication (s) are due for refill today: yes  Requested medication (s) are on the active medication list: yes  Last refill:  03/25/2019  Future visit scheduled: yes  Notes to clinic: one inhaler should last one month   Requested Prescriptions  Pending Prescriptions Disp Refills   albuterol (VENTOLIN HFA) 108 (90 Base) MCG/ACT inhaler [Pharmacy Med Name: ALBUTEROL HFA 90 MCG INHALER]  0    Sig: Inhale 2 puffs into the lungs every 6 (six) hours as needed for wheezing or shortness of breath.     Pulmonology:  Beta Agonists Failed - 04/16/2019 11:27 AM      Failed - One inhaler should last at least one month. If the patient is requesting refills earlier, contact the patient to check for uncontrolled symptoms.      Passed - Valid encounter within last 12 months    Recent Outpatient Visits          3 weeks ago Gastroesophageal reflux disease without esophagitis   [facility] Merrie Roof [place], [place]   6 months ago Essential hypertension   [facility] [place], Jeannette How, MD   1 year ago Advanced care planning/counseling discussion   [facility] Guadalupe Maple, MD   1 year ago Essential hypertension   [place], Jeannette How, MD   2 years ago Essential hypertension   [place], [place]      Future Appointments            In 5 months Orene Desanctis, Lilia Argue, PA-C [facility], [place]   In 56 months  [employer], [place]

## 2019-06-05 DIAGNOSIS — J4520 Mild intermittent asthma, uncomplicated: Secondary | ICD-10-CM | POA: Diagnosis not present

## 2019-06-05 DIAGNOSIS — J310 Chronic rhinitis: Secondary | ICD-10-CM | POA: Diagnosis not present

## 2019-06-05 DIAGNOSIS — G4733 Obstructive sleep apnea (adult) (pediatric): Secondary | ICD-10-CM | POA: Diagnosis not present

## 2019-06-19 ENCOUNTER — Other Ambulatory Visit: Payer: Self-pay

## 2019-06-19 DIAGNOSIS — K219 Gastro-esophageal reflux disease without esophagitis: Secondary | ICD-10-CM

## 2019-06-19 MED ORDER — OMEPRAZOLE 40 MG PO CPDR: 40.0000 mg | DELAYED_RELEASE_CAPSULE | Freq: Every day | ORAL | 3 refills | Status: DC

## 2019-06-19 NOTE — Telephone Encounter (Signed)
Refill request for Omeprazole LOV: 03/25/19 Next Appt: 09/25/19 both with Apolonio Schneiders

## 2019-07-15 DIAGNOSIS — L820 Inflamed seborrheic keratosis: Secondary | ICD-10-CM | POA: Diagnosis not present

## 2019-07-15 DIAGNOSIS — L821 Other seborrheic keratosis: Secondary | ICD-10-CM | POA: Diagnosis not present

## 2019-07-15 DIAGNOSIS — L570 Actinic keratosis: Secondary | ICD-10-CM | POA: Diagnosis not present

## 2019-07-15 DIAGNOSIS — D2261 Melanocytic nevi of right upper limb, including shoulder: Secondary | ICD-10-CM | POA: Diagnosis not present

## 2019-07-15 DIAGNOSIS — D485 Neoplasm of uncertain behavior of skin: Secondary | ICD-10-CM | POA: Diagnosis not present

## 2019-07-15 DIAGNOSIS — R202 Paresthesia of skin: Secondary | ICD-10-CM | POA: Diagnosis not present

## 2019-07-15 DIAGNOSIS — D2262 Melanocytic nevi of left upper limb, including shoulder: Secondary | ICD-10-CM | POA: Diagnosis not present

## 2019-07-15 DIAGNOSIS — X32XXXA Exposure to sunlight, initial encounter: Secondary | ICD-10-CM | POA: Diagnosis not present

## 2019-07-15 DIAGNOSIS — D2271 Melanocytic nevi of right lower limb, including hip: Secondary | ICD-10-CM | POA: Diagnosis not present

## 2019-07-15 DIAGNOSIS — Z85828 Personal history of other malignant neoplasm of skin: Secondary | ICD-10-CM | POA: Diagnosis not present

## 2019-09-25 ENCOUNTER — Other Ambulatory Visit: Payer: Self-pay

## 2019-09-25 ENCOUNTER — Encounter: Payer: Self-pay | Admitting: Family Medicine

## 2019-09-25 ENCOUNTER — Ambulatory Visit (INDEPENDENT_AMBULATORY_CARE_PROVIDER_SITE_OTHER): Payer: Medicare Other | Admitting: Family Medicine

## 2019-09-25 VITALS — BP 139/72 | HR 81 | Temp 98.1°F | Wt 196.0 lb

## 2019-09-25 DIAGNOSIS — M2061 Acquired deformities of toe(s), unspecified, right foot: Secondary | ICD-10-CM | POA: Diagnosis not present

## 2019-09-25 DIAGNOSIS — E7800 Pure hypercholesterolemia, unspecified: Secondary | ICD-10-CM

## 2019-09-25 DIAGNOSIS — J309 Allergic rhinitis, unspecified: Secondary | ICD-10-CM

## 2019-09-25 DIAGNOSIS — I10 Essential (primary) hypertension: Secondary | ICD-10-CM

## 2019-09-25 DIAGNOSIS — J42 Unspecified chronic bronchitis: Secondary | ICD-10-CM | POA: Diagnosis not present

## 2019-09-25 DIAGNOSIS — M25551 Pain in right hip: Secondary | ICD-10-CM

## 2019-09-25 DIAGNOSIS — Z9989 Dependence on other enabling machines and devices: Secondary | ICD-10-CM | POA: Diagnosis not present

## 2019-09-25 DIAGNOSIS — G4733 Obstructive sleep apnea (adult) (pediatric): Secondary | ICD-10-CM | POA: Diagnosis not present

## 2019-09-25 MED ORDER — BENAZEPRIL HCL 40 MG PO TABS: 40.0000 mg | ORAL_TABLET | Freq: Every day | ORAL | 1 refills | Status: DC

## 2019-09-25 MED ORDER — MONTELUKAST SODIUM 10 MG PO TABS: 10.0000 mg | ORAL_TABLET | Freq: Every day | ORAL | 1 refills | Status: DC

## 2019-09-25 NOTE — Progress Notes (Signed)
BP 139/72   Pulse 81   Temp 98.1 F (36.7 C) (Oral)   Wt 196 lb (88.9 kg)   SpO2 96%   BMI 28.94 kg/m    Subjective:    Patient ID: Carl Fernandez, male    DOB: December 31, 1940, 79 y.o.   MRN: [id number]  HPI: Carl Fernandez is a 79 y.o. male  Chief Complaint  Patient presents with  . Hypertension  . COPD  . Gastroesophageal Reflux   Here today for 6 month f/u chronic conditions.   Right hip pain intermittently the last few weeks, worst episode was one day last week after a long car ride. Takes tylenol when it comes on which seems to help. Dull, aching sensation, no radiation of pain. No known hip or low back issues in the past but did have a bad fall onto backside about 6 years ago.   Right great toe feels like it's trying to grow across the other toe. Denies swelling, redness, numbness, tingling. Not trying anything OTC for this.   HTN - rarely checking home BPs but when checking 12-s=130s/80s. Taking medications faithfully without side effects. Denies CP, SOB, HAs, dizziness.   COPD - Breathing stable, no recent exacerbations. On advair and prn albuterol  OSA - Stable on CPAP, sleeping well  Allergic rhinitis - No recent exacerbations, taking singulair, flonase and claritin daily  Relevant past medical, surgical, family and social history reviewed and updated as indicated. Interim medical history since our last visit reviewed. Allergies and medications reviewed and updated.  Review of Systems  Per HPI unless specifically indicated above     Objective:    BP 139/72   Pulse 81   Temp 98.1 F (36.7 C) (Oral)   Wt 196 lb (88.9 kg)   SpO2 96%   BMI 28.94 kg/m   Wt Readings from Last 3 Encounters:  09/25/19 196 lb (88.9 kg)  03/25/19 197 lb (89.4 kg)  03/25/19 197 lb 9.6 oz (89.6 kg)    Physical Exam Vitals and nursing note reviewed.  Constitutional:      Appearance: Normal appearance.  HENT:     Head: Atraumatic.  Eyes:     Extraocular Movements: Extraocular  movements intact.     Conjunctiva/sclera: Conjunctivae normal.  Cardiovascular:     Rate and Rhythm: Normal rate and regular rhythm.  Pulmonary:     Effort: Pulmonary effort is normal.     Breath sounds: Normal breath sounds.  Musculoskeletal:        General: Normal range of motion.     Cervical back: Normal range of motion and neck supple.     Comments: Right first two toes crossing over one another mildly. No redness, swelling, decreased mobility  Skin:    General: Skin is warm and dry.  Neurological:     General: No focal deficit present.     Mental Status: He is oriented to person, place, and time.  Psychiatric:        Mood and Affect: Mood normal.        Thought Content: Thought content normal.        Judgment: Judgment normal.     Results for orders placed or performed in visit on 09/25/19  Comprehensive metabolic panel  Result Value Ref Range   Glucose 69 65 - 99 mg/dL   BUN 15 8 - 27 mg/dL   Creatinine, Ser 1.28 (H) 0.76 - 1.27 mg/dL   GFR calc non Af Amer 53 (L) >59 mL/min/1.73  GFR calc Af Amer 61 >59 mL/min/1.73   BUN/Creatinine Ratio 12 10 - 24   Sodium 143 134 - 144 mmol/L   Potassium 4.5 3.5 - 5.2 mmol/L   Chloride 104 96 - 106 mmol/L   CO2 24 20 - 29 mmol/L   Calcium 8.9 8.6 - 10.2 mg/dL   Total Protein 6.7 6.0 - 8.5 g/dL   Albumin 4.4 3.7 - 4.7 g/dL   Globulin, Total 2.3 1.5 - 4.5 g/dL   Albumin/Globulin Ratio 1.9 1.2 - 2.2   Bilirubin Total 0.5 0.0 - 1.2 mg/dL   Alkaline Phosphatase 75 48 - 121 IU/L   AST 16 0 - 40 IU/L   ALT 8 0 - 44 IU/L  Lipid Panel w/o Chol/HDL Ratio  Result Value Ref Range   Cholesterol, Total 210 (H) 100 - 199 mg/dL   Triglycerides 234 (H) 0 - 149 mg/dL   HDL 33 (L) >39 mg/dL   VLDL Cholesterol Cal 42 (H) 5 - 40 mg/dL   LDL Chol Calc (NIH) 135 (H) 0 - 99 mg/dL      Assessment & Plan:   Problem List Items Addressed This Visit      Cardiovascular and Mediastinum   Essential hypertension - Primary    BPs stable and WNL,  continue current regimen      Relevant Medications   benazepril (LOTENSIN) 40 MG tablet   Other Relevant Orders   Comprehensive metabolic panel (Completed)     Respiratory   COPD (chronic obstructive pulmonary disease) (HCC)    Stable and well controlled without recent exacerbations, continue current regimen      Relevant Medications   montelukast (SINGULAIR) 10 MG tablet   OSA on CPAP    Stable on CPAP, continue current regimen      Allergic rhinitis    Stable and well controlled, continue current regimen        Other   Hypercholesteremia    Recheck lipids, adjust as needed. Continue current regimen and lifestyle modifications      Relevant Medications   benazepril (LOTENSIN) 40 MG tablet   Other Relevant Orders   Lipid Panel w/o Chol/HDL Ratio (Completed)    Other Visit Diagnoses    Right hip pain       Obtain x-ray, discussed supportive care and OTC remedies   Relevant Orders   DG Hip Unilat W OR W/O Pelvis 2-3 Views Right   Deformity of toe of right foot       Discussed trying to avoid narrow shoes and pressure to avoid further crossing. Podiatry if worsening or affecting mobility at any point       Follow up plan: Return in about 6 months (around 03/27/2020) for 6 month f/u.

## 2019-09-26 LAB — COMPREHENSIVE METABOLIC PANEL
ALT: 8 IU/L (ref 0–44)
AST: 16 IU/L (ref 0–40)
Albumin/Globulin Ratio: 1.9 (ref 1.2–2.2)
Albumin: 4.4 g/dL (ref 3.7–4.7)
Alkaline Phosphatase: 75 IU/L (ref 48–121)
BUN/Creatinine Ratio: 12 (ref 10–24)
BUN: 15 mg/dL (ref 8–27)
Bilirubin Total: 0.5 mg/dL (ref 0.0–1.2)
CO2: 24 mmol/L (ref 20–29)
Calcium: 8.9 mg/dL (ref 8.6–10.2)
Chloride: 104 mmol/L (ref 96–106)
Creatinine, Ser: 1.28 mg/dL — ABNORMAL HIGH (ref 0.76–1.27)
GFR calc Af Amer: 61 mL/min/{1.73_m2} (ref >59)
GFR calc non Af Amer: 53 mL/min/{1.73_m2} — ABNORMAL LOW (ref >59)
Globulin, Total: 2.3 g/dL (ref 1.5–4.5)
Glucose: 69 mg/dL (ref 65–99)
Potassium: 4.5 mmol/L (ref 3.5–5.2)
Sodium: 143 mmol/L (ref 134–144)
Total Protein: 6.7 g/dL (ref 6.0–8.5)

## 2019-09-26 LAB — LIPID PANEL W/O CHOL/HDL RATIO
Cholesterol, Total: 210 mg/dL — ABNORMAL HIGH (ref 100–199)
HDL: 33 mg/dL — ABNORMAL LOW (ref >39)
LDL Chol Calc (NIH): 135 mg/dL — ABNORMAL HIGH (ref 0–99)
Triglycerides: 234 mg/dL — ABNORMAL HIGH (ref 0–149)
VLDL Cholesterol Cal: 42 mg/dL — ABNORMAL HIGH (ref 5–40)

## 2019-09-29 ENCOUNTER — Encounter: Payer: Self-pay | Admitting: Family Medicine

## 2019-10-07 NOTE — Assessment & Plan Note (Signed)
Stable and well controlled, continue current regimen 

## 2019-10-07 NOTE — Assessment & Plan Note (Signed)
BPs stable and WNL, continue current regimen 

## 2019-10-07 NOTE — Assessment & Plan Note (Signed)
Stable and well controlled without recent exacerbations, continue current regimen

## 2019-10-07 NOTE — Assessment & Plan Note (Signed)
Stable on CPAP, continue current regimen 

## 2019-10-07 NOTE — Assessment & Plan Note (Signed)
Recheck lipids, adjust as needed. Continue current regimen and lifestyle modifications 

## 2019-10-09 DIAGNOSIS — G4733 Obstructive sleep apnea (adult) (pediatric): Secondary | ICD-10-CM | POA: Diagnosis not present

## 2019-10-09 DIAGNOSIS — J310 Chronic rhinitis: Secondary | ICD-10-CM | POA: Diagnosis not present

## 2019-10-09 DIAGNOSIS — J4520 Mild intermittent asthma, uncomplicated: Secondary | ICD-10-CM | POA: Diagnosis not present

## 2020-01-14 DIAGNOSIS — H40003 Preglaucoma, unspecified, bilateral: Secondary | ICD-10-CM | POA: Diagnosis not present

## 2020-02-14 DIAGNOSIS — Z23 Encounter for immunization: Secondary | ICD-10-CM | POA: Diagnosis not present

## 2020-03-10 DIAGNOSIS — J4520 Mild intermittent asthma, uncomplicated: Secondary | ICD-10-CM | POA: Diagnosis not present

## 2020-03-10 DIAGNOSIS — J310 Chronic rhinitis: Secondary | ICD-10-CM | POA: Diagnosis not present

## 2020-03-10 DIAGNOSIS — G4733 Obstructive sleep apnea (adult) (pediatric): Secondary | ICD-10-CM | POA: Diagnosis not present

## 2020-03-17 ENCOUNTER — Telehealth: Payer: Self-pay | Admitting: Family Medicine

## 2020-03-17 NOTE — Telephone Encounter (Signed)
Copied from [place] [id number]. Topic: Medicare AWV >> Mar 17, 2020  1:07 PM Weston Anna wrote: Reason for CRM:  Left message to cancel AWV scheduled Nov 18,2021 due to [facility] not in the office this day.  New AWV is scheduled to do by phone Nov 19,2021 at 10:30

## 2020-03-25 ENCOUNTER — Ambulatory Visit (INDEPENDENT_AMBULATORY_CARE_PROVIDER_SITE_OTHER): Payer: Medicare Other | Admitting: Family Medicine

## 2020-03-25 ENCOUNTER — Ambulatory Visit: Payer: Medicare Other | Admitting: Family Medicine

## 2020-03-25 ENCOUNTER — Other Ambulatory Visit: Payer: Self-pay

## 2020-03-25 VITALS — BP 123/69 | HR 78 | Temp 98.0°F | Ht 71.0 in | Wt 191.8 lb

## 2020-03-25 DIAGNOSIS — K219 Gastro-esophageal reflux disease without esophagitis: Secondary | ICD-10-CM | POA: Diagnosis not present

## 2020-03-25 DIAGNOSIS — J42 Unspecified chronic bronchitis: Secondary | ICD-10-CM

## 2020-03-25 DIAGNOSIS — J309 Allergic rhinitis, unspecified: Secondary | ICD-10-CM | POA: Diagnosis not present

## 2020-03-25 DIAGNOSIS — I10 Essential (primary) hypertension: Secondary | ICD-10-CM | POA: Diagnosis not present

## 2020-03-25 DIAGNOSIS — E7800 Pure hypercholesterolemia, unspecified: Secondary | ICD-10-CM

## 2020-03-25 DIAGNOSIS — J4520 Mild intermittent asthma, uncomplicated: Secondary | ICD-10-CM

## 2020-03-25 MED ORDER — FLUTICASONE PROPIONATE 50 MCG/ACT NA SUSP: 2.0000 | Freq: Every day | NASAL | 12 refills | Status: DC

## 2020-03-25 MED ORDER — ADVAIR HFA 115-21 MCG/ACT IN AERO: INHALATION_SPRAY | RESPIRATORY_TRACT | 3 refills | Status: DC

## 2020-03-25 MED ORDER — BENAZEPRIL HCL 40 MG PO TABS: 40.0000 mg | ORAL_TABLET | Freq: Every day | ORAL | 1 refills | Status: DC

## 2020-03-25 MED ORDER — MONTELUKAST SODIUM 10 MG PO TABS: 10.0000 mg | ORAL_TABLET | Freq: Every day | ORAL | 1 refills | Status: DC

## 2020-03-25 MED ORDER — OMEPRAZOLE 40 MG PO CPDR: 40.0000 mg | DELAYED_RELEASE_CAPSULE | Freq: Every day | ORAL | 3 refills | Status: DC

## 2020-03-25 MED ORDER — ALBUTEROL SULFATE HFA 108 (90 BASE) MCG/ACT IN AERS: 2.0000 | INHALATION_SPRAY | Freq: Four times a day (QID) | RESPIRATORY_TRACT | 1 refills | Status: DC | PRN

## 2020-03-25 NOTE — Assessment & Plan Note (Signed)
Rechecking labs today. Await results. Treat as needed.  °

## 2020-03-25 NOTE — Assessment & Plan Note (Signed)
Under good control on current regimen. Continue current regimen. Continue to monitor. Call with any concerns. Refills given. Labs drawn today.   

## 2020-03-25 NOTE — Progress Notes (Signed)
BP 123/69   Pulse 78   Temp 98 F (36.7 C) (Oral)   Ht 5\' 11"  (1.803 m)   Wt 191 lb 12.8 oz (87 kg)   SpO2 95%   BMI 26.75 kg/m    Subjective:    Patient ID: Carl Fernandez, male    DOB: 06-14-1940, 79 y.o.   MRN: [id number]  HPI: Carl Fernandez is a 79 y.o. male  Chief Complaint  Patient presents with  . Hypertension  . Gastroesophageal Reflux  . Back Pain    Hurts on and off for past 2 to 3 months  . picking up stuff    Patient is finding it difficult to pick stuff up off the floor   HYPERTENSION Hypertension status: controlled  Satisfied with current treatment? yes Duration of hypertension: chronic BP monitoring frequency:  not checking BP medication side effects:  no Medication compliance: excellent compliance Previous BP meds: benazepril Aspirin: no Recurrent headaches: no Visual changes: no Palpitations: no Dyspnea: no Chest pain: no Lower extremity edema: no Dizzy/lightheaded: no   HYPERLIPIDEMIA Hyperlipidemia status: stable Satisfied with current treatment?  yes Side effects:  Not on anything Supplements: none Aspirin:  no The 10-year ASCVD risk score Mikey Bussing DC Jr., et al., 2013) is: 36.7%   Values used to calculate the score:     Age: 5 years     Sex: Male     Is Non-Hispanic African American: No     Diabetic: No     Tobacco smoker: No     Systolic Blood Pressure: 174 mmHg     Is BP treated: Yes     HDL Cholesterol: 33 mg/dL     Total Cholesterol: 210 mg/dL Chest pain:  no Coronary artery disease:  no  GERD GERD control status: stable  Satisfied with current treatment? yes Heartburn frequency: rare Medication side effects: no  Medication compliance: excellent Dysphagia: no Odynophagia:  no Hematemesis: no Blood in stool: no EGD: no  ASTHMA Asthma status: stable Satisfied with current treatment?: yes Albuterol/rescue inhaler frequency: rarely Dyspnea frequency: none Wheezing frequency: none Cough frequency: rarely Nocturnal  symptom frequency: never Limitation of activity: no Current upper respiratory symptoms: no Aerochamber/spacer use: no Visits to ER or Urgent Care in past year: no Pneumovax: Up to Date Influenza: Up to Date   Relevant past medical, surgical, family and social history reviewed and updated as indicated. Interim medical history since our last visit reviewed. Allergies and medications reviewed and updated.  Review of Systems  Constitutional: Negative.   Respiratory: Negative.   Cardiovascular: Negative.   Gastrointestinal: Negative.   Musculoskeletal: Negative.   Psychiatric/Behavioral: Negative.     Per HPI unless specifically indicated above     Objective:    BP 123/69   Pulse 78   Temp 98 F (36.7 C) (Oral)   Ht 5\' 11"  (1.803 m)   Wt 191 lb 12.8 oz (87 kg)   SpO2 95%   BMI 26.75 kg/m   Wt Readings from Last 3 Encounters:  03/25/20 191 lb 12.8 oz (87 kg)  09/25/19 196 lb (88.9 kg)  03/25/19 197 lb (89.4 kg)    Physical Exam Vitals and nursing note reviewed.  Constitutional:      General: He is not in acute distress.    Appearance: Normal appearance. He is not ill-appearing, toxic-appearing or diaphoretic.  HENT:     Head: Normocephalic and atraumatic.     Right Ear: External ear normal.     Left Ear: External ear  normal.     Nose: Nose normal.     Mouth/Throat:     Mouth: Mucous membranes are moist.     Pharynx: Oropharynx is clear.  Eyes:     General: No scleral icterus.       Right eye: No discharge.        Left eye: No discharge.     Extraocular Movements: Extraocular movements intact.     Conjunctiva/sclera: Conjunctivae normal.     Pupils: Pupils are equal, round, and reactive to light.  Cardiovascular:     Rate and Rhythm: Normal rate and regular rhythm.     Pulses: Normal pulses.     Heart sounds: Normal heart sounds. No murmur heard.  No friction rub. No gallop.   Pulmonary:     Effort: Pulmonary effort is normal. No respiratory distress.      Breath sounds: Normal breath sounds. No stridor. No wheezing, rhonchi or rales.  Chest:     Chest wall: No tenderness.  Musculoskeletal:        General: Normal range of motion.     Cervical back: Normal range of motion and neck supple.  Skin:    General: Skin is warm and dry.     Capillary Refill: Capillary refill takes less than 2 seconds.     Coloration: Skin is not jaundiced or pale.     Findings: No bruising, erythema, lesion or rash.  Neurological:     General: No focal deficit present.     Mental Status: He is alert and oriented to person, place, and time. Mental status is at baseline.  Psychiatric:        Mood and Affect: Mood normal.        Behavior: Behavior normal.        Thought Content: Thought content normal.        Judgment: Judgment normal.     Results for orders placed or performed in visit on 09/25/19  Comprehensive metabolic panel  Result Value Ref Range   Glucose 69 65 - 99 mg/dL   BUN 15 8 - 27 mg/dL   Creatinine, Ser 1.28 (H) 0.76 - 1.27 mg/dL   GFR calc non Af Amer 53 (L) >59 mL/min/1.73   GFR calc Af Amer 61 >59 mL/min/1.73   BUN/Creatinine Ratio 12 10 - 24   Sodium 143 134 - 144 mmol/L   Potassium 4.5 3.5 - 5.2 mmol/L   Chloride 104 96 - 106 mmol/L   CO2 24 20 - 29 mmol/L   Calcium 8.9 8.6 - 10.2 mg/dL   Total Protein 6.7 6.0 - 8.5 g/dL   Albumin 4.4 3.7 - 4.7 g/dL   Globulin, Total 2.3 1.5 - 4.5 g/dL   Albumin/Globulin Ratio 1.9 1.2 - 2.2   Bilirubin Total 0.5 0.0 - 1.2 mg/dL   Alkaline Phosphatase 75 48 - 121 IU/L   AST 16 0 - 40 IU/L   ALT 8 0 - 44 IU/L  Lipid Panel w/o Chol/HDL Ratio  Result Value Ref Range   Cholesterol, Total 210 (H) 100 - 199 mg/dL   Triglycerides 234 (H) 0 - 149 mg/dL   HDL 33 (L) >39 mg/dL   VLDL Cholesterol Cal 42 (H) 5 - 40 mg/dL   LDL Chol Calc (NIH) 135 (H) 0 - 99 mg/dL      Assessment & Plan:   Problem List Items Addressed This Visit      Cardiovascular and Mediastinum   Essential hypertension - Primary  Under good control on current regimen. Continue current regimen. Continue to monitor. Call with any concerns. Refills given. Labs drawn today.        Relevant Medications   benazepril (LOTENSIN) 40 MG tablet   Other Relevant Orders   Comprehensive metabolic panel     Respiratory   COPD (chronic obstructive pulmonary disease) ([place])    Under good control on current regimen. Continue current regimen. Continue to monitor. Call with any concerns. Refills given. Labs drawn today.        Relevant Medications   montelukast (SINGULAIR) 10 MG tablet   fluticasone-salmeterol (ADVAIR HFA) 115-21 MCG/ACT inhaler   fluticasone (FLONASE) 50 MCG/ACT nasal spray   albuterol (VENTOLIN HFA) 108 (90 Base) MCG/ACT inhaler   Allergic rhinitis    Under good control on current regimen. Continue current regimen. Continue to monitor. Call with any concerns. Refills given. Labs drawn today.        Relevant Medications   fluticasone (FLONASE) 50 MCG/ACT nasal spray   Mild intermittent asthma without complication    Under good control on current regimen. Continue current regimen. Continue to monitor. Call with any concerns. Refills given. Labs drawn today.        Relevant Medications   montelukast (SINGULAIR) 10 MG tablet   fluticasone-salmeterol (ADVAIR HFA) 115-21 MCG/ACT inhaler   albuterol (VENTOLIN HFA) 108 (90 Base) MCG/ACT inhaler   Other Relevant Orders   CBC with Differential/Platelet   Comprehensive metabolic panel     Digestive   GERD (gastroesophageal reflux disease)    Under good control on current regimen. Continue current regimen. Continue to monitor. Call with any concerns. Refills given. Labs drawn today.        Relevant Medications   omeprazole (PRILOSEC) 40 MG capsule   Other Relevant Orders   CBC with Differential/Platelet   Comprehensive metabolic panel     Other   Hypercholesteremia    Rechecking labs today. Await results. Treat as needed.       Relevant  Medications   benazepril (LOTENSIN) 40 MG tablet   Other Relevant Orders   Comprehensive metabolic panel   Lipid Panel w/o Chol/HDL Ratio       Follow up plan: Return in about 6 months (around 09/22/2020) for Physical.

## 2020-03-26 ENCOUNTER — Ambulatory Visit: Payer: Medicare Other

## 2020-03-26 LAB — LIPID PANEL W/O CHOL/HDL RATIO
Cholesterol, Total: 201 mg/dL — ABNORMAL HIGH (ref 100–199)
HDL: 38 mg/dL — ABNORMAL LOW (ref >39)
LDL Chol Calc (NIH): 141 mg/dL — ABNORMAL HIGH (ref 0–99)
Triglycerides: 122 mg/dL (ref 0–149)
VLDL Cholesterol Cal: 22 mg/dL (ref 5–40)

## 2020-03-26 LAB — COMPREHENSIVE METABOLIC PANEL
ALT: 11 IU/L (ref 0–44)
AST: 14 IU/L (ref 0–40)
Albumin/Globulin Ratio: 1.7 (ref 1.2–2.2)
Albumin: 4.4 g/dL (ref 3.7–4.7)
Alkaline Phosphatase: 86 IU/L (ref 44–121)
BUN/Creatinine Ratio: 11 (ref 10–24)
BUN: 14 mg/dL (ref 8–27)
Bilirubin Total: 0.9 mg/dL (ref 0.0–1.2)
CO2: 24 mmol/L (ref 20–29)
Calcium: 9 mg/dL (ref 8.6–10.2)
Chloride: 105 mmol/L (ref 96–106)
Creatinine, Ser: 1.33 mg/dL — ABNORMAL HIGH (ref 0.76–1.27)
GFR calc Af Amer: 58 mL/min/{1.73_m2} — ABNORMAL LOW (ref >59)
GFR calc non Af Amer: 50 mL/min/{1.73_m2} — ABNORMAL LOW (ref >59)
Globulin, Total: 2.6 g/dL (ref 1.5–4.5)
Glucose: 87 mg/dL (ref 65–99)
Potassium: 4.2 mmol/L (ref 3.5–5.2)
Sodium: 143 mmol/L (ref 134–144)
Total Protein: 7 g/dL (ref 6.0–8.5)

## 2020-03-26 LAB — CBC WITH DIFFERENTIAL/PLATELET
Basophils Absolute: 0 10*3/uL (ref 0.0–0.2)
Basos: 1 %
EOS (ABSOLUTE): 0.2 10*3/uL (ref 0.0–0.4)
Eos: 2 %
Hematocrit: 42.7 % (ref 37.5–51.0)
Hemoglobin: 14.3 g/dL (ref 13.0–17.7)
Immature Grans (Abs): 0 10*3/uL (ref 0.0–0.1)
Immature Granulocytes: 0 %
Lymphocytes Absolute: 1.2 10*3/uL (ref 0.7–3.1)
Lymphs: 18 %
MCH: 28.8 pg (ref 26.6–33.0)
MCHC: 33.5 g/dL (ref 31.5–35.7)
MCV: 86 fL (ref 79–97)
Monocytes Absolute: 0.6 10*3/uL (ref 0.1–0.9)
Monocytes: 10 %
Neutrophils Absolute: 4.5 10*3/uL (ref 1.4–7.0)
Neutrophils: 69 %
Platelets: 222 10*3/uL (ref 150–450)
RBC: 4.96 x10E6/uL (ref 4.14–5.80)
RDW: 12.4 % (ref 11.6–15.4)
WBC: 6.5 10*3/uL (ref 3.4–10.8)

## 2020-03-27 ENCOUNTER — Encounter: Payer: Self-pay | Admitting: Family Medicine

## 2020-03-27 ENCOUNTER — Ambulatory Visit (INDEPENDENT_AMBULATORY_CARE_PROVIDER_SITE_OTHER): Payer: Medicare Other

## 2020-03-27 VITALS — Ht 71.0 in | Wt 191.8 lb

## 2020-03-27 DIAGNOSIS — Z0000 Encounter for general adult medical examination without abnormal findings: Secondary | ICD-10-CM | POA: Diagnosis not present

## 2020-03-27 NOTE — Progress Notes (Signed)
I connected with Carl Fernandez today by telephone and verified that I am speaking with the correct person using two identifiers. Location patient: home Location provider: work Persons participating in the virtual visit: Carl Fernandez, Glenna Durand LPN.   I discussed the limitations, risks, security and privacy concerns of performing an evaluation and management service by telephone and the availability of in person appointments. I also discussed with the patient that there may be a patient responsible charge related to this service. The patient expressed understanding and verbally consented to this telephonic visit.    Interactive audio and video telecommunications were attempted between this provider and patient, however failed, due to patient having technical difficulties OR patient did not have access to video capability.  We continued and completed visit with audio only.     Vital signs may be patient reported or missing.  Subjective:   Carl Fernandez is a 79 y.o. male who presents for Medicare Annual/Subsequent preventive examination.  Review of Systems     Cardiac Risk Factors include: advanced age (>43men, >62 women);hypertension;male gender;sedentary lifestyle     Objective:    Today's Vitals   03/27/20 1027  Weight: 191 lb 12.8 oz (87 kg)  Height: 5\' 11"  (1.803 m)   Body mass index is 26.75 kg/m.  Advanced Directives 03/27/2020 03/25/2019 03/22/2018 01/26/2017  Does Patient Have a Medical Advance Directive? Yes Yes Yes Yes  Type of Paramedic of [place];Living will Living will;Healthcare Power of [place];Living will [place];Living will  Does patient want to make changes to medical advance directive? - - No - Patient declined -  Copy of [place] in Chart? No - copy requested No - copy requested No - copy requested No - copy requested    Current Medications  (verified) Outpatient Encounter Medications as of 03/27/2020  Medication Sig  . albuterol (VENTOLIN HFA) 108 (90 Base) MCG/ACT inhaler Inhale 2 puffs into the lungs every 6 (six) hours as needed for wheezing or shortness of breath.  . benazepril (LOTENSIN) 40 MG tablet Take 1 tablet (40 mg total) by mouth daily.  . fluticasone (FLONASE) 50 MCG/ACT nasal spray Place 2 sprays into both nostrils daily.  . fluticasone-salmeterol (ADVAIR HFA) 115-21 MCG/ACT inhaler Inhale 2 puffs into the lungs 2 (two) times daily.  . fluticasone-salmeterol (ADVAIR HFA) 115-21 MCG/ACT inhaler Inhale 2 inhalations into the lungs every 12 (twelve) hours  . loratadine (CLARITIN) 10 MG tablet Take 1 tablet (10 mg total) by mouth once daily.  . montelukast (SINGULAIR) 10 MG tablet Take 1 tablet (10 mg total) by mouth at bedtime.  Marland Kitchen omeprazole (PRILOSEC) 40 MG capsule Take 1 capsule (40 mg total) by mouth daily.   No facility-administered encounter medications on file as of 03/27/2020.    Allergies (verified) Amoxicillin and Penicillin g   History: Past Medical History:  Diagnosis Date  . COPD (chronic obstructive pulmonary disease) ([place])   . Diverticulitis   . GERD (gastroesophageal reflux disease)   . Hard of hearing   . Kidney stones   . OSA on CPAP   . Plantar fasciitis    Past Surgical History:  Procedure Laterality Date  . LITHOTRIPSY    . nasoplasty     Family History  Problem Relation Age of Onset  . Hypertension Mother   . Osteoporosis Mother   . Cancer Mother        skin on her nose  . Heart disease Father  Social History   Socioeconomic History  . Marital status: Married    Spouse name: Not on file  . Number of children: Not on file  . Years of education: Not on file  . Highest education level: Not on file  Occupational History  . Not on file  Tobacco Use  . Smoking status: Never Smoker  . Smokeless tobacco: Never Used  Vaping Use  . Vaping Use: Never used  Substance and  Sexual Activity  . Alcohol use: No  . Drug use: No  . Sexual activity: Not on file  Other Topics Concern  . Not on file  Social History Narrative  . Not on file   Social Determinants of Health   Financial Resource Strain: Low Risk   . Difficulty of Paying Living Expenses: Not hard at all  Food Insecurity: No Food Insecurity  . Worried About Charity fundraiser in the Last Year: Never true  . Ran Out of Food in the Last Year: Never true  Transportation Needs: No Transportation Needs  . Lack of Transportation (Medical): No  . Lack of Transportation (Non-Medical): No  Physical Activity: Inactive  . Days of Exercise per Week: 0 days  . Minutes of Exercise per Session: 0 min  Stress: No Stress Concern Present  . Feeling of Stress : Not at all  Social Connections:   . Frequency of Communication with Friends and Family: Not on file  . Frequency of Social Gatherings with Friends and Family: Not on file  . Attends Religious Services: Not on file  . Active Member of Clubs or Organizations: Not on file  . Attends Archivist Meetings: Not on file  . Marital Status: Not on file    Tobacco Counseling Counseling given: Not Answered   Clinical Intake:  Pre-visit preparation completed: Yes  Pain : No/denies pain     Nutritional Status: BMI 25 -29 Overweight Nutritional Risks: None Diabetes: No  How often do you need to have someone help you when you read instructions, pamphlets, or other written materials from your doctor or pharmacy?: 1 - Never What is the last grade level you completed in school?: college  Diabetic? No   Interpreter Needed?: No  Information entered by :: NAllen LPN   Activities of Daily Living In your present state of health, do you have any difficulty performing the following activities: 03/27/2020 03/25/2020  Hearing? Y Y  Comment has hearing aides -  Vision? Y Y  Difficulty concentrating or making decisions? N Y  Comment - sometimes   Walking or climbing stairs? N N  Dressing or bathing? N N  Doing errands, shopping? N N  Preparing Food and eating ? N -  Using the Toilet? N -  In the past six months, have you accidently leaked urine? Y -  Do you have problems with loss of bowel control? N -  Managing your Medications? N -  Managing your Finances? N -  Housekeeping or managing your Housekeeping? N -  Some recent data might be hidden    Patient Care Team: Guadalupe Maple, MD as PCP - General (Family Medicine) Erby Pian, MD as Referring Physician (Specialist) Dasher, Rayvon Char, MD (Dermatology) Manya Silvas, MD (Inactive) (Gastroenterology)  Indicate any recent Medical Services you may have received from other than [facility] providers in the past year (date may be approximate).     Assessment:   This is a routine wellness examination for Carl Fernandez.  Hearing/Vision screen  Hearing  Screening   125Hz  250Hz  500Hz  1000Hz  2000Hz  3000Hz  4000Hz  6000Hz  8000Hz   Right ear:           Left ear:           Vision Screening Comments: Regular eye exams, Dr. Murvin Natal  Dietary issues and exercise activities discussed:    Goals    . Patient Stated     03/27/2020, stay well      Depression Screen PHQ 2/9 Scores 03/27/2020 03/25/2020 03/25/2019 03/22/2018 09/13/2017 03/08/2017 01/31/2017  PHQ - 2 Score 0 0 0 0 0 0 0    Fall Risk Fall Risk  03/27/2020 03/25/2020 03/25/2019 03/22/2018 09/13/2017  Falls in the past year? 0 0 0 0 No  Number falls in past yr: - - 0 0 -  Injury with Fall? - - 0 0 -  Risk for fall due to : Medication side effect - - - -  Follow up Falls evaluation completed;Education provided;Falls prevention discussed - - - -    Any stairs in or around the home? Yes  If so, are there any without handrails? No  Home free of loose throw rugs in walkways, pet beds, electrical cords, etc? Yes  Adequate lighting in your home to reduce risk of falls? Yes   ASSISTIVE DEVICES UTILIZED TO PREVENT  FALLS:  Life alert? No  Use of a cane, walker or w/c? No  Grab bars in the bathroom? Yes  Shower chair or bench in shower? No  Elevated toilet seat or a handicapped toilet? No   TIMED UP AND GO:  Was the test performed? No .       Cognitive Function:     6CIT Screen 03/27/2020 03/25/2019 03/22/2018 01/26/2017  What Year? 0 points 0 points 0 points 0 points  What month? 0 points 0 points 0 points 0 points  What time? 0 points 0 points 0 points 0 points  Count back from 20 0 points 0 points 0 points 0 points  Months in reverse 0 points 0 points 0 points 0 points  Repeat phrase 0 points 0 points 0 points 0 points  Total Score 0 0 0 0    Immunizations Immunization History  Administered Date(s) Administered  . Influenza, High Dose Seasonal PF 01/26/2016, 01/26/2017, 03/22/2018  . Influenza,inj,Quad PF,6+ Mos 04/29/2015  . Influenza-Unspecified 01/07/2014, 01/10/2019, 02/12/2020  . PFIZER SARS-COV-2 Vaccination 05/29/2019, 06/22/2019, 02/12/2020  . Pneumococcal Conjugate-13 04/29/2015  . Pneumococcal Polysaccharide-23 01/26/2017  . Tdap 10/09/2013  . Zoster Recombinat (Shingrix) 06/21/2018, 10/09/2018, 01/11/2019    TDAP status: Up to date Flu Vaccine status: Up to date Pneumococcal vaccine status: Up to date Covid-19 vaccine status: Completed vaccines  Qualifies for Shingles Vaccine? Yes   Zostavax completed No   Shingrix Completed?: Yes  Screening Tests Health Maintenance  Topic Date Due  . Hepatitis C Screening  Never done  . TETANUS/TDAP  10/10/2023  . INFLUENZA VACCINE  Completed  . COVID-19 Vaccine  Completed  . PNA vac Low Risk Adult  Completed    Health Maintenance  Health Maintenance Due  Topic Date Due  . Hepatitis C Screening  Never done    Colorectal cancer screening: No longer required.   Lung Cancer Screening: (Low Dose CT Chest recommended if Age 79-80 years, 30 pack-year currently smoking OR have quit w/in 15years.) does not qualify.    Lung Cancer Screening Referral: no  Additional Screening:  Hepatitis C Screening: does qualify;  Vision Screening: Recommended annual ophthalmology exams for early detection of glaucoma  and other disorders of the eye. Is the patient up to date with their annual eye exam?  Yes  Who is the provider or what is the name of the office in which the patient attends annual eye exams? Dr. Murvin Natal If pt is not established with a provider, would they like to be referred to a provider to establish care? No .   Dental Screening: Recommended annual dental exams for proper oral hygiene  Community Resource Referral / Chronic Care Management: CRR required this visit?  No   CCM required this visit?  No      Plan:     I have personally reviewed and noted the following in the patient's chart:   . Medical and social history . Use of alcohol, tobacco or illicit drugs  . Current medications and supplements . Functional ability and status . Nutritional status . Physical activity . Advanced directives . List of other physicians . Hospitalizations, surgeries, and ER visits in previous 12 months . Vitals . Screenings to include cognitive, depression, and falls . Referrals and appointments  In addition, I have reviewed and discussed with patient certain preventive protocols, quality metrics, and best practice recommendations. A written personalized care plan for preventive services as well as general preventive health recommendations were provided to patient.     Kellie Simmering, LPN   13/24/4010   Nurse Notes:

## 2020-03-27 NOTE — Patient Instructions (Signed)
Carl Fernandez , Thank you for taking time to come for your Medicare Wellness Visit. I appreciate your ongoing commitment to your health goals. Please review the following plan we discussed and let me know if I can assist you in the future.   Screening recommendations/referrals: Colonoscopy: not required Recommended yearly ophthalmology/optometry visit for glaucoma screening and checkup Recommended yearly dental visit for hygiene and checkup  Vaccinations: Influenza vaccine: completed 02/12/2020, due 12/07/2020 Pneumococcal vaccine: completed 01/26/2017 Tdap vaccine: completed 10/09/2013, due 10/10/2023 Shingles vaccine: completed   Covid-19:  05/29/2019, 06/22/2019, 02/12/2020  Advanced directives: Please bring a copy of your POA (Power of Attorney) and/or Living Will to your next appointment.   Conditions/risks identified: none  Next appointment: Follow up in one year for your annual wellness visit.   Preventive Care 63 Years and Older, Male Preventive care refers to lifestyle choices and visits with your health care provider that can promote health and wellness. What does preventive care include?  A yearly physical exam. This is also called an annual well check.  Dental exams once or twice a year.  Routine eye exams. Ask your health care provider how often you should have your eyes checked.  Personal lifestyle choices, including:  Daily care of your teeth and gums.  Regular physical activity.  Eating a healthy diet.  Avoiding tobacco and drug use.  Limiting alcohol use.  Practicing safe sex.  Taking low doses of aspirin every day.  Taking vitamin and mineral supplements as recommended by your health care provider. What happens during an annual well check? The services and screenings done by your health care provider during your annual well check will depend on your age, overall health, lifestyle risk factors, and family history of disease. Counseling  Your health care provider  may ask you questions about your:  Alcohol use.  Tobacco use.  Drug use.  Emotional well-being.  Home and relationship well-being.  Sexual activity.  Eating habits.  History of falls.  Memory and ability to understand (cognition).  Work and work Statistician. Screening  You may have the following tests or measurements:  Height, weight, and BMI.  Blood pressure.  Lipid and cholesterol levels. These may be checked every 5 years, or more frequently if you are over 79 years old.  Skin check.  Lung cancer screening. You may have this screening every year starting at age 79 if you have a 30-pack-year history of smoking and currently smoke or have quit within the past 15 years.  Fecal occult blood test (FOBT) of the stool. You may have this test every year starting at age 79.  Flexible sigmoidoscopy or colonoscopy. You may have a sigmoidoscopy every 5 years or a colonoscopy every 10 years starting at age 79.  Prostate cancer screening. Recommendations will vary depending on your family history and other risks.  Hepatitis C blood test.  Hepatitis B blood test.  Sexually transmitted disease (STD) testing.  Diabetes screening. This is done by checking your blood sugar (glucose) after you have not eaten for a while (fasting). You may have this done every 1-3 years.  Abdominal aortic aneurysm (AAA) screening. You may need this if you are a current or former smoker.  Osteoporosis. You may be screened starting at age 79 if you are at high risk. Talk with your health care provider about your test results, treatment options, and if necessary, the need for more tests. Vaccines  Your health care provider may recommend certain vaccines, such as:  Influenza vaccine. This  is recommended every year.  Tetanus, diphtheria, and acellular pertussis (Tdap, Td) vaccine. You may need a Td booster every 10 years.  Zoster vaccine. You may need this after age 79.  Pneumococcal 13-valent  conjugate (PCV13) vaccine. One dose is recommended after age 9.  Pneumococcal polysaccharide (PPSV23) vaccine. One dose is recommended after age 79. Talk to your health care provider about which screenings and vaccines you need and how often you need them. This information is not intended to replace advice given to you by your health care provider. Make sure you discuss any questions you have with your health care provider. Document Released: 05/22/2015 Document Revised: 01/13/2016 Document Reviewed: 02/24/2015 Elsevier Interactive Patient Education  2017 [place] Prevention in the Home Falls can cause injuries. They can happen to people of all ages. There are many things you can do to make your home safe and to help prevent falls. What can I do on the outside of my home?  Regularly fix the edges of walkways and driveways and fix any cracks.  Remove anything that might make you trip as you walk through a door, such as a raised step or threshold.  Trim any bushes or trees on the path to your home.  Use bright outdoor lighting.  Clear any walking paths of anything that might make someone trip, such as rocks or tools.  Regularly check to see if handrails are loose or broken. Make sure that both sides of any steps have handrails.  Any raised decks and porches should have guardrails on the edges.  Have any leaves, snow, or ice cleared regularly.  Use sand or salt on walking paths during winter.  Clean up any spills in your garage right away. This includes oil or grease spills. What can I do in the bathroom?  Use night lights.  Install grab bars by the toilet and in the tub and shower. Do not use towel bars as grab bars.  Use non-skid mats or decals in the tub or shower.  If you need to sit down in the shower, use a plastic, non-slip stool.  Keep the floor dry. Clean up any water that spills on the floor as soon as it happens.  Remove soap buildup in the tub or  shower regularly.  Attach bath mats securely with double-sided non-slip rug tape.  Do not have throw rugs and other things on the floor that can make you trip. What can I do in the bedroom?  Use night lights.  Make sure that you have a light by your bed that is easy to reach.  Do not use any sheets or blankets that are too big for your bed. They should not hang down onto the floor.  Have a firm chair that has side arms. You can use this for support while you get dressed.  Do not have throw rugs and other things on the floor that can make you trip. What can I do in the kitchen?  Clean up any spills right away.  Avoid walking on wet floors.  Keep items that you use a lot in easy-to-reach places.  If you need to reach something above you, use a strong step stool that has a grab bar.  Keep electrical cords out of the way.  Do not use floor polish or wax that makes floors slippery. If you must use wax, use non-skid floor wax.  Do not have throw rugs and other things on the floor that can make you  trip. What can I do with my stairs?  Do not leave any items on the stairs.  Make sure that there are handrails on both sides of the stairs and use them. Fix handrails that are broken or loose. Make sure that handrails are as long as the stairways.  Check any carpeting to make sure that it is firmly attached to the stairs. Fix any carpet that is loose or worn.  Avoid having throw rugs at the top or bottom of the stairs. If you do have throw rugs, attach them to the floor with carpet tape.  Make sure that you have a light switch at the top of the stairs and the bottom of the stairs. If you do not have them, ask someone to add them for you. What else can I do to help prevent falls?  Wear shoes that:  Do not have high heels.  Have rubber bottoms.  Are comfortable and fit you well.  Are closed at the toe. Do not wear sandals.  If you use a stepladder:  Make sure that it is fully  opened. Do not climb a closed stepladder.  Make sure that both sides of the stepladder are locked into place.  Ask someone to hold it for you, if possible.  Clearly mark and make sure that you can see:  Any grab bars or handrails.  First and last steps.  Where the edge of each step is.  Use tools that help you move around (mobility aids) if they are needed. These include:  Canes.  Walkers.  Scooters.  Crutches.  Turn on the lights when you go into a dark area. Replace any light bulbs as soon as they burn out.  Set up your furniture so you have a clear path. Avoid moving your furniture around.  If any of your floors are uneven, fix them.  If there are any pets around you, be aware of where they are.  Review your medicines with your doctor. Some medicines can make you feel dizzy. This can increase your chance of falling. Ask your doctor what other things that you can do to help prevent falls. This information is not intended to replace advice given to you by your health care provider. Make sure you discuss any questions you have with your health care provider. Document Released: 02/19/2009 Document Revised: 10/01/2015 Document Reviewed: 05/30/2014 Elsevier Interactive Patient Education  2017 [employer].

## 2020-05-05 DIAGNOSIS — Z20822 Contact with and (suspected) exposure to covid-19: Secondary | ICD-10-CM | POA: Diagnosis not present

## 2020-05-05 DIAGNOSIS — Z1152 Encounter for screening for COVID-19: Secondary | ICD-10-CM | POA: Diagnosis not present

## 2020-05-05 DIAGNOSIS — Z03818 Encounter for observation for suspected exposure to other biological agents ruled out: Secondary | ICD-10-CM | POA: Diagnosis not present

## 2020-07-14 DIAGNOSIS — Z85828 Personal history of other malignant neoplasm of skin: Secondary | ICD-10-CM | POA: Diagnosis not present

## 2020-07-14 DIAGNOSIS — X32XXXA Exposure to sunlight, initial encounter: Secondary | ICD-10-CM | POA: Diagnosis not present

## 2020-07-14 DIAGNOSIS — D2272 Melanocytic nevi of left lower limb, including hip: Secondary | ICD-10-CM | POA: Diagnosis not present

## 2020-07-14 DIAGNOSIS — D2262 Melanocytic nevi of left upper limb, including shoulder: Secondary | ICD-10-CM | POA: Diagnosis not present

## 2020-07-14 DIAGNOSIS — L570 Actinic keratosis: Secondary | ICD-10-CM | POA: Diagnosis not present

## 2020-07-14 DIAGNOSIS — D225 Melanocytic nevi of trunk: Secondary | ICD-10-CM | POA: Diagnosis not present

## 2020-07-21 DIAGNOSIS — J4520 Mild intermittent asthma, uncomplicated: Secondary | ICD-10-CM | POA: Diagnosis not present

## 2020-07-21 DIAGNOSIS — G4733 Obstructive sleep apnea (adult) (pediatric): Secondary | ICD-10-CM | POA: Diagnosis not present

## 2020-07-21 DIAGNOSIS — J310 Chronic rhinitis: Secondary | ICD-10-CM | POA: Diagnosis not present

## 2020-11-03 ENCOUNTER — Encounter: Payer: Self-pay | Admitting: Family Medicine

## 2020-11-03 ENCOUNTER — Telehealth: Payer: Self-pay

## 2020-11-03 ENCOUNTER — Other Ambulatory Visit: Payer: Self-pay

## 2020-11-03 ENCOUNTER — Ambulatory Visit (INDEPENDENT_AMBULATORY_CARE_PROVIDER_SITE_OTHER): Payer: Medicare Other | Admitting: Family Medicine

## 2020-11-03 VITALS — BP 147/74 | HR 84

## 2020-11-03 DIAGNOSIS — Z20822 Contact with and (suspected) exposure to covid-19: Secondary | ICD-10-CM | POA: Diagnosis not present

## 2020-11-03 MED ORDER — PREDNISONE 10 MG PO TABS: ORAL_TABLET | ORAL | 0 refills | Status: DC

## 2020-11-03 MED ORDER — BENZONATATE 200 MG PO CAPS: 200.0000 mg | ORAL_CAPSULE | Freq: Two times a day (BID) | ORAL | 0 refills | Status: DC | PRN

## 2020-11-03 MED ORDER — HYDROCOD POLST-CPM POLST ER 10-8 MG/5ML PO SUER: 5.0000 mL | Freq: Two times a day (BID) | ORAL | 0 refills | Status: DC | PRN

## 2020-11-03 NOTE — Progress Notes (Addendum)
BP (!) 147/74   Pulse 84    Subjective:    Patient ID: Carl Fernandez, male    DOB: [birth year], 80 y.o.   MRN: [id number]  HPI: Carl Fernandez is a 80 y.o. male  Chief Complaint  Patient presents with   Generalized Body Aches    Started on Friday, has been takinh OTC Tyl.   Nasal Congestion   Cough   Headache   Sore Throat   hot and cold   UPPER RESPIRATORY TRACT INFECTION Duration: 4 days Worst symptom: body aches, sore throat, cough Fever: no- + chills and sweats Cough: yes Shortness of breath: no Wheezing: no Chest pain: no Chest tightness: no Chest congestion: no Nasal congestion: yes Runny nose: yes Post nasal drip: yes Sneezing: no Sore throat: yes Swollen glands: no Sinus pressure: yes Headache: yes Face pain: no Toothache: no Ear pain: no  Ear pressure: no  Eyes red/itching:no Eye drainage/crusting: yes  Vomiting: no Rash: no Fatigue: yes Sick contacts: yes Strep contacts: no  Context: worse Recurrent sinusitis: no Relief with OTC cold/cough medications: no  Treatments attempted: cold/sinus and anti-histamine    Relevant past medical, surgical, family and social history reviewed and updated as indicated. Interim medical history since our last visit reviewed. Allergies and medications reviewed and updated.  Review of Systems  Constitutional:  Positive for chills, diaphoresis and fatigue. Negative for activity change, appetite change, fever and unexpected weight change.  HENT:  Positive for congestion, postnasal drip, rhinorrhea, sinus pressure, sinus pain and voice change. Negative for dental problem, drooling, ear discharge, ear pain, facial swelling, hearing loss, mouth sores, nosebleeds, sneezing, sore throat, tinnitus and trouble swallowing.   Respiratory:  Positive for cough and chest tightness. Negative for apnea, choking, shortness of breath, wheezing and stridor.   Cardiovascular: Negative.   Gastrointestinal: Negative.   Skin: Negative.    Psychiatric/Behavioral: Negative.     Per HPI unless specifically indicated above     Objective:    BP (!) 147/74   Pulse 84   Wt Readings from Last 3 Encounters:  03/27/20 191 lb 12.8 oz (87 kg)  03/25/20 191 lb 12.8 oz (87 kg)  09/25/19 196 lb (88.9 kg)    Physical Exam Vitals and nursing note reviewed.  Pulmonary:     Effort: Pulmonary effort is normal. No respiratory distress.     Comments: Speaking in full sentences Neurological:     Mental Status: He is alert.  Psychiatric:        Mood and Affect: Mood normal.        Behavior: Behavior normal.        Thought Content: Thought content normal.        Judgment: Judgment normal.    Results for orders placed or performed in visit on 03/25/20  CBC with Differential/Platelet  Result Value Ref Range   WBC 6.5 3.4 - 10.8 x10E3/uL   RBC 4.96 4.14 - 5.80 x10E6/uL   Hemoglobin 14.3 13.0 - 17.7 g/dL   Hematocrit 42.7 37.5 - 51.0 %   MCV 86 79 - 97 fL   MCH 28.8 26.6 - 33.0 pg   MCHC 33.5 31.5 - 35.7 g/dL   RDW 12.4 11.6 - 15.4 %   Platelets 222 150 - 450 x10E3/uL   Neutrophils 69 Not Estab. %   Lymphs 18 Not Estab. %   Monocytes 10 Not Estab. %   Eos 2 Not Estab. %   Basos 1 Not Estab. %   Neutrophils Absolute  4.5 1.4 - 7.0 x10E3/uL   Lymphocytes Absolute 1.2 0.7 - 3.1 x10E3/uL   Monocytes Absolute 0.6 0.1 - 0.9 x10E3/uL   EOS (ABSOLUTE) 0.2 0.0 - 0.4 x10E3/uL   Basophils Absolute 0.0 0.0 - 0.2 x10E3/uL   Immature Granulocytes 0 Not Estab. %   Immature Grans (Abs) 0.0 0.0 - 0.1 x10E3/uL  Comprehensive metabolic panel  Result Value Ref Range   Glucose 87 65 - 99 mg/dL   BUN 14 8 - 27 mg/dL   Creatinine, Ser 1.33 (H) 0.76 - 1.27 mg/dL   GFR calc non Af Amer 50 (L) >59 mL/min/1.73   GFR calc Af Amer 58 (L) >59 mL/min/1.73   BUN/Creatinine Ratio 11 10 - 24   Sodium 143 134 - 144 mmol/L   Potassium 4.2 3.5 - 5.2 mmol/L   Chloride 105 96 - 106 mmol/L   CO2 24 20 - 29 mmol/L   Calcium 9.0 8.6 - 10.2 mg/dL   Total  Protein 7.0 6.0 - 8.5 g/dL   Albumin 4.4 3.7 - 4.7 g/dL   Globulin, Total 2.6 1.5 - 4.5 g/dL   Albumin/Globulin Ratio 1.7 1.2 - 2.2   Bilirubin Total 0.9 0.0 - 1.2 mg/dL   Alkaline Phosphatase 86 44 - 121 IU/L   AST 14 0 - 40 IU/L   ALT 11 0 - 44 IU/L  Lipid Panel w/o Chol/HDL Ratio  Result Value Ref Range   Cholesterol, Total 201 (H) 100 - 199 mg/dL   Triglycerides 122 0 - 149 mg/dL   HDL 38 (L) >39 mg/dL   VLDL Cholesterol Cal 22 5 - 40 mg/dL   LDL Chol Calc (NIH) 141 (H) 0 - 99 mg/dL      Assessment & Plan:   Problem List Items Addressed This Visit   None Visit Diagnoses     Suspected COVID-19 virus infection    -  Primary   Will check rapid test at home and call with results. If + will treat with mulnopirovir. Will treat symptomatically with prednisone, tussionex and tessalon.        Follow up plan: Return 2 week in person follow up with PCP.     This visit was completed via telephone due to the restrictions of the COVID-19 pandemic. All issues as above were discussed and addressed but no physical exam was performed. If it was felt that the patient should be evaluated in the office, they were directed there. The patient verbally consented to this visit. Patient was unable to complete an audio/visual visit due to Lack of equipment. Due to the catastrophic nature of the COVID-19 pandemic, this visit was done through audio contact only. Location of the patient: home Location of the provider: work Those involved with this call:  Provider: Park Liter, DO CMA: Frazier Butt, CMA Front Desk/Registration: Jill Side  Time spent on call:  21 minutes on the phone discussing health concerns. 30 minutes total spent in review of patient's record and preparation of their chart.

## 2020-11-03 NOTE — Telephone Encounter (Signed)
Good. Take the prednisone and the cough medicine. Let us know if getting worse or not getting better.

## 2020-11-03 NOTE — Telephone Encounter (Signed)
Copied from [place] [id number]. Topic: General - Other >> Nov 03, 2020  3:30 PM Yvette Rack wrote: Reason for CRM: Pt called to report that his Covid test results are negative.

## 2020-11-04 DIAGNOSIS — Z20822 Contact with and (suspected) exposure to covid-19: Secondary | ICD-10-CM | POA: Diagnosis not present

## 2020-11-04 DIAGNOSIS — Z03818 Encounter for observation for suspected exposure to other biological agents ruled out: Secondary | ICD-10-CM | POA: Diagnosis not present

## 2020-11-04 NOTE — Telephone Encounter (Signed)
Pt called and reported that he was tested with [facility], he had a PCR test and tested positive. Pt started prednisone and cough medicine, understood message below.

## 2020-11-05 NOTE — Telephone Encounter (Signed)
Pt already has apt on 11/24/2020

## 2020-11-05 NOTE — Telephone Encounter (Signed)
Pl have him come in for a follow up x 2 weeks from now thnx.

## 2020-11-06 ENCOUNTER — Other Ambulatory Visit: Payer: Self-pay | Admitting: Family Medicine

## 2020-11-06 DIAGNOSIS — J42 Unspecified chronic bronchitis: Secondary | ICD-10-CM

## 2020-11-24 ENCOUNTER — Ambulatory Visit
Admission: RE | Admit: 2020-11-24 | Discharge: 2020-11-24 | Disposition: A | Discharge status: Home / Self Care | Payer: Medicare Other | Source: Ambulatory Visit | Attending: Internal Medicine | Admitting: Internal Medicine

## 2020-11-24 ENCOUNTER — Encounter: Payer: Self-pay | Admitting: Internal Medicine

## 2020-11-24 ENCOUNTER — Ambulatory Visit (INDEPENDENT_AMBULATORY_CARE_PROVIDER_SITE_OTHER): Payer: Medicare Other | Admitting: Internal Medicine

## 2020-11-24 ENCOUNTER — Other Ambulatory Visit: Payer: Self-pay

## 2020-11-24 ENCOUNTER — Ambulatory Visit
Admission: RE | Admit: 2020-11-24 | Discharge: 2020-11-24 | Disposition: A | Discharge status: Home / Self Care | Payer: Medicare Other | Attending: Internal Medicine | Admitting: Internal Medicine

## 2020-11-24 VITALS — BP 138/73 | HR 96 | Temp 98.6°F | Ht 68.7 in | Wt 188.6 lb

## 2020-11-24 DIAGNOSIS — E7800 Pure hypercholesterolemia, unspecified: Secondary | ICD-10-CM

## 2020-11-24 DIAGNOSIS — R059 Cough, unspecified: Secondary | ICD-10-CM | POA: Insufficient documentation

## 2020-11-24 DIAGNOSIS — I10 Essential (primary) hypertension: Secondary | ICD-10-CM | POA: Diagnosis not present

## 2020-11-24 DIAGNOSIS — Z1329 Encounter for screening for other suspected endocrine disorder: Secondary | ICD-10-CM

## 2020-11-24 MED ORDER — FEXOFENADINE HCL 180 MG PO TABS: 180.0000 mg | ORAL_TABLET | Freq: Every day | ORAL | 1 refills | Status: DC

## 2020-11-24 NOTE — Progress Notes (Signed)
BP 138/73   Pulse 96   Temp 98.6 F (37 C) (Oral)   Ht 5' 8.7" (1.745 m)   Wt 188 lb 9.6 oz (85.5 kg)   SpO2 96%   BMI 28.09 kg/m    Subjective:    Patient ID: Carl Fernandez, male    DOB: 09/01/1940, 80 y.o.   MRN: [id number]  Chief Complaint  Patient presents with   Covid Positive    Patient was covid positive over 10 days ago, still coughing in the evening, leg pain and still fatigued.     HPI: Carl Fernandez is a 80 y.o. male  2 weeks ago did a home test -ve and PCR was   +ve Has some cough ongoing  Sleep apnea mask causes some irritation on nasal bridge. Sees Dr. Vella Kohler.  Cough This is a recurrent problem. The current episode started 1 to 4 weeks ago. The problem has been gradually improving. The cough is Non-productive. Pertinent negatives include no chest pain, chills, ear congestion, ear pain, fever, headaches, heartburn, myalgias, postnasal drip, rhinorrhea, sore throat, shortness of breath, sweats, weight loss or wheezing. Associated symptoms comments: Fatigue contines..   Chief Complaint  Patient presents with   Covid Positive    Patient was covid positive over 10 days ago, still coughing in the evening, leg pain and still fatigued.     Relevant past medical, surgical, family and social history reviewed and updated as indicated. Interim medical history since our last visit reviewed. Allergies and medications reviewed and updated.  Review of Systems  Constitutional:  Negative for chills, fever and weight loss.  HENT:  Negative for ear pain, postnasal drip, rhinorrhea and sore throat.   Respiratory:  Positive for cough. Negative for shortness of breath and wheezing.   Cardiovascular:  Negative for chest pain.  Gastrointestinal:  Negative for heartburn.  Musculoskeletal:  Negative for myalgias.  Neurological:  Negative for headaches.   Per HPI unless specifically indicated above     Objective:    BP 138/73   Pulse 96   Temp 98.6 F (37 C) (Oral)   Ht  5' 8.7" (1.745 m)   Wt 188 lb 9.6 oz (85.5 kg)   SpO2 96%   BMI 28.09 kg/m   Wt Readings from Last 3 Encounters:  11/24/20 188 lb 9.6 oz (85.5 kg)  03/27/20 191 lb 12.8 oz (87 kg)  03/25/20 191 lb 12.8 oz (87 kg)    Physical Exam Vitals and nursing note reviewed.  Constitutional:      General: He is not in acute distress.    Appearance: Normal appearance. He is not ill-appearing or diaphoretic.  HENT:     Head: Normocephalic and atraumatic.     Right Ear: Tympanic membrane and external ear normal. There is no impacted cerumen.     Left Ear: External ear normal.     Nose: No congestion or rhinorrhea.     Mouth/Throat:     Pharynx: No oropharyngeal exudate or posterior oropharyngeal erythema.  Eyes:     Conjunctiva/sclera: Conjunctivae normal.     Pupils: Pupils are equal, round, and reactive to light.  Cardiovascular:     Rate and Rhythm: Normal rate and regular rhythm.     Heart sounds: No murmur heard.   No friction rub. No gallop.  Pulmonary:     Effort: No respiratory distress.     Breath sounds: No stridor. No wheezing or rhonchi.  Chest:     Chest wall: No tenderness.  Abdominal:     General: Abdomen is flat. Bowel sounds are normal.     Palpations: Abdomen is soft. There is no mass.     Tenderness: There is no abdominal tenderness.  Musculoskeletal:     Cervical back: Normal range of motion and neck supple. No rigidity or tenderness.     Left lower leg: No edema.  Skin:    General: Skin is warm and dry.  Neurological:     Mental Status: He is alert.          Current Outpatient Medications:    albuterol (VENTOLIN HFA) 108 (90 Base) MCG/ACT inhaler, Inhale 2 puffs into the lungs every 6 (six) hours as needed for wheezing or shortness of breath., Disp: 18 g, Rfl: 1   benazepril (LOTENSIN) 40 MG tablet, Take 1 tablet (40 mg total) by mouth daily., Disp: 90 tablet, Rfl: 1   fexofenadine (ALLEGRA ALLERGY) 180 MG tablet, Take 1 tablet (180 mg total) by mouth  daily., Disp: 10 tablet, Rfl: 1   fluticasone (FLONASE) 50 MCG/ACT nasal spray, Place 2 sprays into both nostrils daily., Disp: 48 g, Rfl: 12   fluticasone-salmeterol (ADVAIR HFA) 115-21 MCG/ACT inhaler, Inhale 2 puffs into the lungs 2 (two) times daily., Disp: , Rfl:    fluticasone-salmeterol (ADVAIR HFA) 115-21 MCG/ACT inhaler, Inhale 2 inhalations into the lungs every 12 (twelve) hours, Disp: 3 each, Rfl: 3   montelukast (SINGULAIR) 10 MG tablet, Take 1 tablet (10 mg total) by mouth at bedtime., Disp: 90 tablet, Rfl: 0   omeprazole (PRILOSEC) 40 MG capsule, Take 1 capsule (40 mg total) by mouth daily., Disp: 90 capsule, Rfl: 3   benzonatate (TESSALON) 200 MG capsule, Take 1 capsule (200 mg total) by mouth 2 (two) times daily as needed for cough. (Patient not taking: Reported on 11/24/2020), Disp: 20 capsule, Rfl: 0   chlorpheniramine-HYDROcodone (TUSSIONEX PENNKINETIC ER) 10-8 MG/5ML SUER, Take 5 mLs by mouth every 12 (twelve) hours as needed. (Patient not taking: Reported on 11/24/2020), Disp: 50 mL, Rfl: 0   predniSONE (DELTASONE) 10 MG tablet, 6 tabs in the AM day 1 and 2, 5 tabs the next 2 days, decrease by 1 every other day until gone (Patient not taking: Reported on 11/24/2020), Disp: 42 tablet, Rfl: 0    Assessment & Plan:  Post COVID cough : will startt pt on allegra  Check  CXR  No ho wheezing.  No post nasal drip    2. COPD is on singulair and advair for such continue.  Chronic stable      Problem List Items Addressed This Visit       Cardiovascular and Mediastinum   Essential hypertension   Relevant Orders   PSA Total+%Free (Serial)   CBC with Differential/Platelet   Thyroid Panel With TSH   Lipid panel   CMP14+EGFR     Other   Hypercholesteremia   Relevant Orders   PSA Total+%Free (Serial)   CBC with Differential/Platelet   Thyroid Panel With TSH   Lipid panel   CMP14+EGFR   Other Visit Diagnoses     Cough    -  Primary   Relevant Orders   DG Chest 2 View  (Completed)   Screening for thyroid disorder       Relevant Orders   PSA Total+%Free (Serial)   CBC with Differential/Platelet   Thyroid Panel With TSH   Lipid panel   CMP14+EGFR        Orders Placed This Encounter  Procedures  DG Chest 2 View   PSA Total+%Free (Serial)   CBC with Differential/Platelet   Thyroid Panel With TSH   Lipid panel   CMP14+EGFR     Meds ordered this encounter  Medications   fexofenadine (ALLEGRA ALLERGY) 180 MG tablet    Sig: Take 1 tablet (180 mg total) by mouth daily.    Dispense:  10 tablet    Refill:  1     Follow up plan: Return in about 6 weeks (around 01/05/2021).

## 2020-11-25 NOTE — Progress Notes (Signed)
Please let pt know this was normal. Has mild thickening of his bronchi ? Sec to COPD.

## 2020-11-26 ENCOUNTER — Other Ambulatory Visit: Payer: Self-pay | Admitting: Family Medicine

## 2020-11-26 DIAGNOSIS — I10 Essential (primary) hypertension: Secondary | ICD-10-CM

## 2020-12-08 DIAGNOSIS — Z20822 Contact with and (suspected) exposure to covid-19: Secondary | ICD-10-CM | POA: Diagnosis not present

## 2020-12-29 ENCOUNTER — Other Ambulatory Visit: Payer: Self-pay

## 2020-12-29 ENCOUNTER — Other Ambulatory Visit: Payer: Medicare Other

## 2020-12-29 DIAGNOSIS — E7800 Pure hypercholesterolemia, unspecified: Secondary | ICD-10-CM | POA: Diagnosis not present

## 2020-12-29 DIAGNOSIS — I10 Essential (primary) hypertension: Secondary | ICD-10-CM | POA: Diagnosis not present

## 2020-12-29 DIAGNOSIS — Z1329 Encounter for screening for other suspected endocrine disorder: Secondary | ICD-10-CM | POA: Diagnosis not present

## 2020-12-30 LAB — CMP14+EGFR
ALT: 10 IU/L (ref 0–44)
AST: 14 IU/L (ref 0–40)
Albumin/Globulin Ratio: 2 (ref 1.2–2.2)
Albumin: 4.4 g/dL (ref 3.7–4.7)
Alkaline Phosphatase: 74 IU/L (ref 44–121)
BUN/Creatinine Ratio: 11 (ref 10–24)
BUN: 11 mg/dL (ref 8–27)
Bilirubin Total: 0.6 mg/dL (ref 0.0–1.2)
CO2: 22 mmol/L (ref 20–29)
Calcium: 9.1 mg/dL (ref 8.6–10.2)
Chloride: 107 mmol/L — ABNORMAL HIGH (ref 96–106)
Creatinine, Ser: 1.03 mg/dL (ref 0.76–1.27)
Globulin, Total: 2.2 g/dL (ref 1.5–4.5)
Glucose: 82 mg/dL (ref 65–99)
Potassium: 4.1 mmol/L (ref 3.5–5.2)
Sodium: 142 mmol/L (ref 134–144)
Total Protein: 6.6 g/dL (ref 6.0–8.5)
eGFR: 73 mL/min/{1.73_m2} (ref >59)

## 2020-12-30 LAB — CBC WITH DIFFERENTIAL/PLATELET
Basophils Absolute: 0.1 10*3/uL (ref 0.0–0.2)
Basos: 1 %
EOS (ABSOLUTE): 0.2 10*3/uL (ref 0.0–0.4)
Eos: 4 %
Hematocrit: 42.9 % (ref 37.5–51.0)
Hemoglobin: 13.9 g/dL (ref 13.0–17.7)
Immature Grans (Abs): 0 10*3/uL (ref 0.0–0.1)
Immature Granulocytes: 0 %
Lymphocytes Absolute: 1.2 10*3/uL (ref 0.7–3.1)
Lymphs: 20 %
MCH: 28.5 pg (ref 26.6–33.0)
MCHC: 32.4 g/dL (ref 31.5–35.7)
MCV: 88 fL (ref 79–97)
Monocytes Absolute: 0.5 10*3/uL (ref 0.1–0.9)
Monocytes: 7 %
Neutrophils Absolute: 4.1 10*3/uL (ref 1.4–7.0)
Neutrophils: 68 %
Platelets: 191 10*3/uL (ref 150–450)
RBC: 4.87 x10E6/uL (ref 4.14–5.80)
RDW: 12.9 % (ref 11.6–15.4)
WBC: 6.1 10*3/uL (ref 3.4–10.8)

## 2020-12-30 LAB — THYROID PANEL WITH TSH
Free Thyroxine Index: 1.6 (ref 1.2–4.9)
T3 Uptake Ratio: 24 % (ref 24–39)
T4, Total: 6.5 ug/dL (ref 4.5–12.0)
TSH: 3.14 u[IU]/mL (ref 0.450–4.500)

## 2020-12-30 LAB — LIPID PANEL
Chol/HDL Ratio: 5 ratio (ref 0.0–5.0)
Cholesterol, Total: 206 mg/dL — ABNORMAL HIGH (ref 100–199)
HDL: 41 mg/dL (ref >39)
LDL Chol Calc (NIH): 142 mg/dL — ABNORMAL HIGH (ref 0–99)
Triglycerides: 125 mg/dL (ref 0–149)
VLDL Cholesterol Cal: 23 mg/dL (ref 5–40)

## 2021-01-05 ENCOUNTER — Ambulatory Visit: Payer: Medicare Other | Admitting: Internal Medicine

## 2021-01-15 DIAGNOSIS — H40003 Preglaucoma, unspecified, bilateral: Secondary | ICD-10-CM | POA: Diagnosis not present

## 2021-01-20 ENCOUNTER — Ambulatory Visit: Payer: Medicare Other | Admitting: Internal Medicine

## 2021-01-21 DIAGNOSIS — J310 Chronic rhinitis: Secondary | ICD-10-CM | POA: Diagnosis not present

## 2021-01-21 DIAGNOSIS — J4520 Mild intermittent asthma, uncomplicated: Secondary | ICD-10-CM | POA: Diagnosis not present

## 2021-01-21 DIAGNOSIS — G4733 Obstructive sleep apnea (adult) (pediatric): Secondary | ICD-10-CM | POA: Diagnosis not present

## 2021-01-28 ENCOUNTER — Ambulatory Visit: Payer: Medicare Other | Admitting: Internal Medicine

## 2021-02-03 ENCOUNTER — Other Ambulatory Visit: Payer: Self-pay

## 2021-02-03 ENCOUNTER — Ambulatory Visit (INDEPENDENT_AMBULATORY_CARE_PROVIDER_SITE_OTHER): Payer: Medicare Other | Admitting: Internal Medicine

## 2021-02-03 ENCOUNTER — Encounter: Payer: Self-pay | Admitting: Internal Medicine

## 2021-02-03 VITALS — BP 122/68 | HR 70 | Temp 98.5°F | Ht 68.74 in | Wt 192.2 lb

## 2021-02-03 DIAGNOSIS — I10 Essential (primary) hypertension: Secondary | ICD-10-CM

## 2021-02-03 DIAGNOSIS — J42 Unspecified chronic bronchitis: Secondary | ICD-10-CM | POA: Diagnosis not present

## 2021-02-03 DIAGNOSIS — R351 Nocturia: Secondary | ICD-10-CM

## 2021-02-03 DIAGNOSIS — Z125 Encounter for screening for malignant neoplasm of prostate: Secondary | ICD-10-CM | POA: Insufficient documentation

## 2021-02-03 DIAGNOSIS — J309 Allergic rhinitis, unspecified: Secondary | ICD-10-CM

## 2021-02-03 DIAGNOSIS — Z23 Encounter for immunization: Secondary | ICD-10-CM

## 2021-02-03 DIAGNOSIS — E785 Hyperlipidemia, unspecified: Secondary | ICD-10-CM

## 2021-02-03 MED ORDER — ATORVASTATIN CALCIUM 40 MG PO TABS: 40.0000 mg | ORAL_TABLET | Freq: Every day | ORAL | 5 refills | Status: DC

## 2021-02-03 MED ORDER — BENAZEPRIL HCL 20 MG PO TABS: 20.0000 mg | ORAL_TABLET | Freq: Every day | ORAL | 4 refills | Status: DC

## 2021-02-03 NOTE — Progress Notes (Signed)
BP 122/68   Pulse 70   Temp 98.5 F (36.9 C) (Oral)   Ht 5' 8.74" (1.746 m)   Wt 192 lb 3.2 oz (87.2 kg)   SpO2 96%   BMI 28.60 kg/m    Subjective:    Patient ID: Carl Fernandez, male    DOB: 11/05/1940, 80 y.o.   MRN: [id number]  Chief Complaint  Patient presents with   Hypertension   Hyperlipidemia   Cough    HPI: Carl Fernandez is a 80 y.o. male  HPI  Chief Complaint  Patient presents with   Hypertension   Hyperlipidemia   Cough    Relevant past medical, surgical, family and social history reviewed and updated as indicated. Interim medical history since our last visit reviewed. Allergies and medications reviewed and updated.  Review of Systems  Per HPI unless specifically indicated above     Objective:    BP 122/68   Pulse 70   Temp 98.5 F (36.9 C) (Oral)   Ht 5' 8.74" (1.746 m)   Wt 192 lb 3.2 oz (87.2 kg)   SpO2 96%   BMI 28.60 kg/m   Wt Readings from Last 3 Encounters:  02/03/21 192 lb 3.2 oz (87.2 kg)  11/24/20 188 lb 9.6 oz (85.5 kg)  03/27/20 191 lb 12.8 oz (87 kg)    Physical Exam  Results for orders placed or performed in visit on 12/29/20  CMP14+EGFR  Result Value Ref Range   Glucose 82 65 - 99 mg/dL   BUN 11 8 - 27 mg/dL   Creatinine, Ser 1.03 0.76 - 1.27 mg/dL   eGFR 73 >59 mL/min/1.73   BUN/Creatinine Ratio 11 10 - 24   Sodium 142 134 - 144 mmol/L   Potassium 4.1 3.5 - 5.2 mmol/L   Chloride 107 (H) 96 - 106 mmol/L   CO2 22 20 - 29 mmol/L   Calcium 9.1 8.6 - 10.2 mg/dL   Total Protein 6.6 6.0 - 8.5 g/dL   Albumin 4.4 3.7 - 4.7 g/dL   Globulin, Total 2.2 1.5 - 4.5 g/dL   Albumin/Globulin Ratio 2.0 1.2 - 2.2   Bilirubin Total 0.6 0.0 - 1.2 mg/dL   Alkaline Phosphatase 74 44 - 121 IU/L   AST 14 0 - 40 IU/L   ALT 10 0 - 44 IU/L  Lipid panel  Result Value Ref Range   Cholesterol, Total 206 (H) 100 - 199 mg/dL   Triglycerides 125 0 - 149 mg/dL   HDL 41 >39 mg/dL   VLDL Cholesterol Cal 23 5 - 40 mg/dL   LDL Chol Calc (NIH)  142 (H) 0 - 99 mg/dL   Chol/HDL Ratio 5.0 0.0 - 5.0 ratio  Thyroid Panel With TSH  Result Value Ref Range   TSH 3.140 0.450 - 4.500 uIU/mL   T4, Total 6.5 4.5 - 12.0 ug/dL   T3 Uptake Ratio 24 24 - 39 %   Free Thyroxine Index 1.6 1.2 - 4.9  CBC with Differential/Platelet  Result Value Ref Range   WBC 6.1 3.4 - 10.8 x10E3/uL   RBC 4.87 4.14 - 5.80 x10E6/uL   Hemoglobin 13.9 13.0 - 17.7 g/dL   Hematocrit 42.9 37.5 - 51.0 %   MCV 88 79 - 97 fL   MCH 28.5 26.6 - 33.0 pg   MCHC 32.4 31.5 - 35.7 g/dL   RDW 12.9 11.6 - 15.4 %   Platelets 191 150 - 450 x10E3/uL   Neutrophils 68 Not Estab. %   Lymphs  20 Not Estab. %   Monocytes 7 Not Estab. %   Eos 4 Not Estab. %   Basos 1 Not Estab. %   Neutrophils Absolute 4.1 1.4 - 7.0 x10E3/uL   Lymphocytes Absolute 1.2 0.7 - 3.1 x10E3/uL   Monocytes Absolute 0.5 0.1 - 0.9 x10E3/uL   EOS (ABSOLUTE) 0.2 0.0 - 0.4 x10E3/uL   Basophils Absolute 0.1 0.0 - 0.2 x10E3/uL   Immature Granulocytes 0 Not Estab. %   Immature Grans (Abs) 0.0 0.0 - 0.1 x10E3/uL        Current Outpatient Medications:    albuterol (VENTOLIN HFA) 108 (90 Base) MCG/ACT inhaler, Inhale 2 puffs into the lungs every 6 (six) hours as needed for wheezing or shortness of breath., Disp: 18 g, Rfl: 1   benazepril (LOTENSIN) 40 MG tablet, Take 1 tablet (40 mg total) by mouth daily., Disp: 90 tablet, Rfl: 0   fexofenadine (ALLEGRA ALLERGY) 180 MG tablet, Take 1 tablet (180 mg total) by mouth daily., Disp: 10 tablet, Rfl: 1   fluticasone (FLONASE) 50 MCG/ACT nasal spray, Place 2 sprays into both nostrils daily., Disp: 48 g, Rfl: 12   fluticasone-salmeterol (ADVAIR HFA) 115-21 MCG/ACT inhaler, Inhale 2 puffs into the lungs 2 (two) times daily., Disp: , Rfl:    montelukast (SINGULAIR) 10 MG tablet, Take 1 tablet (10 mg total) by mouth at bedtime., Disp: 90 tablet, Rfl: 0   omeprazole (PRILOSEC) 40 MG capsule, Take 1 capsule (40 mg total) by mouth daily., Disp: 90 capsule, Rfl: 3     Assessment & Plan:  HLD start pt on Lipitor  LDL continues to be very high recheck FLP, check LFT's work on diet, SE of meds explained to pt. low fat and high fiber diet explained to pt. Results for Carl Fernandez, Carl Fernandez (MRN [id number]) as of 02/03/2021 13:58  Ref. Range 03/25/2019 15:22 09/25/2019 10:25 03/25/2020 10:11 11/24/2020 15:12 12/29/2020 09:15  LDL Chol Calc (NIH) Latest Ref Range: 0 - 99 mg/dL  135 (H) 141 (H)  142 (H)    2. HTN reduce to 20 mg bp low normal Continue current meds.  Medication compliance emphasised. pt advised to keep Bp logs. Pt verbalised understanding of the same. Pt to have a low salt diet . Exercise to reach a goal of at least 150 mins a week.  lifestyle modifications explained and pt understands importance of the above.  3. COPD is on singulair and advair for such continue.  Chronic stable  No cough now.  Continue alllegra  4. Nocturia will check PSA last checked 2018    Problem List Items Addressed This Visit   None Visit Diagnoses     Need for influenza vaccination    -  Primary   Relevant Orders   Flu Vaccine QUAD High Dose(Fluad)        Orders Placed This Encounter  Procedures   Flu Vaccine QUAD High Dose(Fluad)     No orders of the defined types were placed in this encounter.    Follow up plan: No follow-ups on file.   Labs next visit : CBC, CMP, FLP,TSH, PSA,

## 2021-02-04 LAB — PSA: Prostate Specific Ag, Serum: 3 ng/mL (ref 0.0–4.0)

## 2021-02-05 ENCOUNTER — Other Ambulatory Visit: Payer: Self-pay | Admitting: Family Medicine

## 2021-02-05 DIAGNOSIS — J42 Unspecified chronic bronchitis: Secondary | ICD-10-CM

## 2021-02-08 ENCOUNTER — Encounter: Payer: Self-pay | Admitting: Internal Medicine

## 2021-02-08 DIAGNOSIS — Z23 Encounter for immunization: Secondary | ICD-10-CM

## 2021-02-08 HISTORY — DX: Encounter for immunization: Z23

## 2021-02-17 ENCOUNTER — Ambulatory Visit (INDEPENDENT_AMBULATORY_CARE_PROVIDER_SITE_OTHER): Payer: Medicare Other

## 2021-02-17 ENCOUNTER — Other Ambulatory Visit: Payer: Self-pay

## 2021-02-17 VITALS — BP 130/71 | HR 79

## 2021-02-17 DIAGNOSIS — I10 Essential (primary) hypertension: Secondary | ICD-10-CM

## 2021-02-17 NOTE — Progress Notes (Signed)
Patient presents today for just a blood pressure check. His Blood pressure today was 130/71 with a pulse of 79. Per Dr. Woodroe Chen patient was informed to keep taking his medication as prescribed and to call if he had any concerns or if BP went higher than 140/90. Patient and wife verbalized understand.

## 2021-03-15 ENCOUNTER — Other Ambulatory Visit: Payer: Self-pay | Admitting: Family Medicine

## 2021-03-15 NOTE — Telephone Encounter (Addendum)
[facility] called and spoke to [place], [facility] about the refill(s) benzapril 20mg  requested. Advised it was sent on 02/03/21 #30/4 refill(s). He says he just needed clarification due to pt calling in Benzapril 40mg  and has new script for 20mg . Wanted to see which dose needed to be filled. Will refuse this refill request.   Requested Prescriptions  Refused Prescriptions Disp Refills   benazepril (LOTENSIN) 40 MG tablet [Pharmacy Med Name: BENAZEPRIL HCL 40 MG TABLET] 90 tablet 0    Sig: Take 1 tablet (40 mg total) by mouth daily.     Cardiovascular:  ACE Inhibitors Passed - 03/15/2021 12:51 PM      Passed - Cr in normal range and within 180 days    Creatinine, Ser  Date Value Ref Range Status  12/29/2020 1.03 0.76 - 1.27 mg/dL Final          Passed - K in normal range and within 180 days    Potassium  Date Value Ref Range Status  12/29/2020 4.1 3.5 - 5.2 mmol/L Final          Passed - Patient is not pregnant      Passed - Last BP in normal range    BP Readings from Last 1 Encounters:  02/17/21 130/71          Passed - Valid encounter within last 6 months    Recent Outpatient Visits           1 month ago Need for influenza vaccination   [facility] Vigg, Avanti, MD   3 months ago Cough   [place], Avanti, MD   4 months ago Suspected COVID-19 virus infection   [employer], [place], DO   11 months ago Essential hypertension   [place], [place], DO   1 year ago Essential hypertension   [place], Lilia Argue, [place]       Future Appointments             In 2 weeks  [facility], [place]   In 4 months Vigg, Avanti, MD [facility], [place]

## 2021-03-29 ENCOUNTER — Ambulatory Visit (INDEPENDENT_AMBULATORY_CARE_PROVIDER_SITE_OTHER): Payer: Medicare Other | Admitting: *Deleted

## 2021-03-29 DIAGNOSIS — Z0000 Encounter for general adult medical examination without abnormal findings: Secondary | ICD-10-CM

## 2021-03-29 NOTE — Progress Notes (Addendum)
Subjective:   Carl Fernandez is a 80 y.o. male who presents for Medicare Annual/Subsequent preventive examination.  I connected with  Carl Fernandez on 03/29/21 by a telephone enabled telemedicine application and verified that I am speaking with the correct person using two identifiers.   I discussed the limitations of evaluation and management by telemedicine. The patient expressed understanding and agreed to proceed.  Patient location: home  Provider location: Tele-Health visit  not in office    Review of Systems     Cardiac Risk Factors include: advanced age (>11men, >62 women);hypertension;male gender     Objective:    Today's Vitals   03/29/21 1033  PainSc: 1    There is no height or weight on file to calculate BMI.  Advanced Directives 03/29/2021 03/27/2020 03/25/2019 03/22/2018 01/26/2017  Does Patient Have a Medical Advance Directive? No Yes Yes Yes Yes  Type of Advance Directive - [place];Living will Living will;Healthcare Power of [place];Living will [place];Living will  Does patient want to make changes to medical advance directive? - - - No - Patient declined -  Copy of [place] in Chart? - No - copy requested No - copy requested No - copy requested No - copy requested  Would patient like information on creating a medical advance directive? No - Patient declined - - - -    Current Medications (verified) Outpatient Encounter Medications as of 03/29/2021  Medication Sig   albuterol (VENTOLIN HFA) 108 (90 Base) MCG/ACT inhaler Inhale 2 puffs into the lungs every 6 (six) hours as needed for wheezing or shortness of breath.   atorvastatin (LIPITOR) 40 MG tablet Take 1 tablet (40 mg total) by mouth daily.   benazepril (LOTENSIN) 20 MG tablet Take 1 tablet (20 mg total) by mouth daily.   montelukast (SINGULAIR) 10 MG tablet Take 1 tablet (10 mg total) by mouth at bedtime.    omeprazole (PRILOSEC) 40 MG capsule Take 1 capsule (40 mg total) by mouth daily.   fluticasone-salmeterol (ADVAIR HFA) 115-21 MCG/ACT inhaler Inhale 2 puffs into the lungs 2 (two) times daily.   No facility-administered encounter medications on file as of 03/29/2021.    Allergies (verified) Amoxicillin and Penicillin g   History: Past Medical History:  Diagnosis Date   COPD (chronic obstructive pulmonary disease) (HCC)    Diverticulitis    GERD (gastroesophageal reflux disease)    Hard of hearing    Kidney stones    OSA on CPAP    Plantar fasciitis    Past Surgical History:  Procedure Laterality Date   LITHOTRIPSY     nasoplasty     Family History  Problem Relation Age of Onset   Hypertension Mother    Osteoporosis Mother    Cancer Mother        skin on her nose   Heart disease Father    Social History   Socioeconomic History   Marital status: Married    Spouse name: Not on file   Number of children: Not on file   Years of education: Not on file   Highest education level: Not on file  Occupational History   Not on file  Tobacco Use   Smoking status: Never   Smokeless tobacco: Never  Vaping Use   Vaping Use: Never used  Substance and Sexual Activity   Alcohol use: No   Drug use: No   Sexual activity: Not on file  Other Topics Concern  Not on file  Social History Narrative   Not on file   Social Determinants of Health   Financial Resource Strain: Low Risk    Difficulty of Paying Living Expenses: Not hard at all  Food Insecurity: No Food Insecurity   Worried About Charity fundraiser in the Last Year: Never true   Arboriculturist in the Last Year: Never true  Transportation Needs: No Transportation Needs   Lack of Transportation (Medical): No   Lack of Transportation (Non-Medical): No  Physical Activity: Inactive   Days of Exercise per Week: 0 days   Minutes of Exercise per Session: 0 min  Stress: No Stress Concern Present   Feeling of Stress :  Only a little  Social Connections: Engineer, building services of Communication with Friends and Family: More than three times a week   Frequency of Social Gatherings with Friends and Family: More than three times a week   Attends Religious Services: More than 4 times per year   Active Member of [employer] or Organizations: Yes   Attends Music therapist: More than 4 times per year   Marital Status: Married    Tobacco Counseling Counseling given: Not Answered   Clinical Intake:  Pre-visit preparation completed: Yes  Pain : 0-10 Pain Score: 1  Pain Type: Chronic pain Pain Location: Back Pain Descriptors / Indicators: Aching, Dull Pain Onset: More than a month ago Pain Frequency: Intermittent Pain Relieving Factors: no Effect of Pain on Daily Activities: no  Pain Relieving Factors: no  Nutritional Risks: None Diabetes: No  How often do you need to have someone help you when you read instructions, pamphlets, or other written materials from your doctor or pharmacy?: 1 - Never  Diabetic?no     Information entered by :: Leroy Kennedy LPN   Activities of Daily Living In your present state of health, do you have any difficulty performing the following activities: 03/29/2021  Hearing? Y  Vision? N  Difficulty concentrating or making decisions? N  Walking or climbing stairs? N  Dressing or bathing? N  Doing errands, shopping? N  Preparing Food and eating ? N  Using the Toilet? N  In the past six months, have you accidently leaked urine? N  Do you have problems with loss of bowel control? N  Managing your Medications? N  Managing your Finances? N  Housekeeping or managing your Housekeeping? N  Some recent data might be hidden    Patient Care Team: Charlynne Cousins, MD as PCP - General Erby Pian, MD as Referring Physician (Specialist) Dasher, Rayvon Char, MD (Dermatology) Manya Silvas, MD (Inactive) (Gastroenterology)  Indicate any recent Medical  Services you may have received from other than [facility] providers in the past year (date may be approximate).     Assessment:   This is a routine wellness examination for Carl Fernandez.  Hearing/Vision screen Hearing Screening - Comments:: Hearing aids bilateral Vision Screening - Comments:: Up to date [place]  Dietary issues and exercise activities discussed: Current Exercise Habits: The patient does not participate in regular exercise at present, Intensity: Not Applicable   Goals Addressed             This Visit's Progress    Patient Stated   On track    03/27/2020, stay well     Patient Stated       Continue current lifestyle Learn to play banjo       Depression Screen PHQ 2/9 Scores 03/29/2021  11/03/2020 03/27/2020 03/25/2020 03/25/2019 03/22/2018 09/13/2017  PHQ - 2 Score 0 0 0 0 0 0 0    Fall Risk Fall Risk  03/29/2021 02/03/2021 11/24/2020 11/03/2020 03/27/2020  Falls in the past year? 0 0 0 0 0  Number falls in past yr: 0 0 0 0 -  Injury with Fall? 0 0 0 0 -  Risk for fall due to : - No Fall Risks No Fall Risks No Fall Risks Medication side effect  Follow up Falls evaluation completed;Falls prevention discussed Falls evaluation completed Falls evaluation completed Falls evaluation completed Falls evaluation completed;Education provided;Falls prevention discussed    FALL RISK PREVENTION PERTAINING TO THE HOME:  Any stairs in or around the home? No  If so, are there any without handrails? No  Home free of loose throw rugs in walkways, pet beds, electrical cords, etc? Yes  Adequate lighting in your home to reduce risk of falls? Yes   ASSISTIVE DEVICES UTILIZED TO PREVENT FALLS:  Life alert? No  Use of a cane, walker or w/c? No  Grab bars in the bathroom? Yes  Shower chair or bench in shower? No  Elevated toilet seat or a handicapped toilet? Yes   TIMED UP AND GO:  Was the test performed? No .    Cognitive Function:     6CIT Screen 03/29/2021 03/27/2020  03/25/2019 03/22/2018 01/26/2017  What Year? 0 points 0 points 0 points 0 points 0 points  What month? 0 points 0 points 0 points 0 points 0 points  What time? 0 points 0 points 0 points 0 points 0 points  Count back from 20 0 points 0 points 0 points 0 points 0 points  Months in reverse 0 points 0 points 0 points 0 points 0 points  Repeat phrase 0 points 0 points 0 points 0 points 0 points  Total Score 0 0 0 0 0    Immunizations Immunization History  Administered Date(s) Administered   Fluad Quad(high Dose 65+) 02/03/2021   Influenza, High Dose Seasonal PF 01/26/2016, 01/26/2017, 03/22/2018   Influenza,inj,Quad PF,6+ Mos 04/29/2015   Influenza-Unspecified 01/07/2014, 01/10/2019, 02/12/2020   PFIZER(Purple Top)SARS-COV-2 Vaccination 05/29/2019, 06/22/2019, 02/12/2020   Pneumococcal Conjugate-13 04/29/2015   Pneumococcal Polysaccharide-23 01/26/2017   Tdap 10/09/2013   Zoster Recombinat (Shingrix) 06/21/2018, 10/09/2018, 01/11/2019    TDAP status: Up to date  Flu Vaccine status: Up to date  Pneumococcal vaccine status: Up to date  Covid-19 vaccine status: Information provided on how to obtain vaccines.   Qualifies for Shingles Vaccine? Yes   Zostavax completed Yes   Shingrix Completed?: Yes  Screening Tests Health Maintenance  Topic Date Due   COVID-19 Vaccine (4 - Booster for Pfizer series) 04/08/2020   TETANUS/TDAP  10/10/2023   Pneumonia Vaccine 26+ Years old  Completed   INFLUENZA VACCINE  Completed   Zoster Vaccines- Shingrix  Completed   HPV VACCINES  Aged Out    Health Maintenance  Health Maintenance Due  Topic Date Due   COVID-19 Vaccine (4 - Booster for Pfizer series) 04/08/2020    Colorectal cancer screening: No longer required.   Lung Cancer Screening: (Low Dose CT Chest recommended if Age 39-80 years, 30 pack-year currently smoking OR have quit w/in 15years.) does not qualify.   Lung Cancer Screening Referral:   Additional Screening:  Hepatitis  C Screening: does qualify;  Vision Screening: Recommended annual ophthalmology exams for early detection of glaucoma and other disorders of the eye. Is the patient up to date with their annual  eye exam?  Yes  Who is the provider or what is the name of the office in which the patient attends annual eye exams? Dr. Murvin Natal If pt is not established with a provider, would they like to be referred to a provider to establish care? No .   Dental Screening: Recommended annual dental exams for proper oral hygiene  Community Resource Referral / Chronic Care Management: CRR required this visit?  No   CCM required this visit?  No      Plan:     I have personally reviewed and noted the following in the patient's chart:   Medical and social history Use of alcohol, tobacco or illicit drugs  Current medications and supplements including opioid prescriptions. Patient is not currently taking opioid prescriptions. Functional ability and status Nutritional status Physical activity Advanced directives List of other physicians Hospitalizations, surgeries, and ER visits in previous 12 months Vitals Screenings to include cognitive, depression, and falls Referrals and appointments  In addition, I have reviewed and discussed with patient certain preventive protocols, quality metrics, and best practice recommendations. A written personalized care plan for preventive services as well as general preventive health recommendations were provided to patient.     Leroy Kennedy, LPN   [id number]   Nurse Notes:

## 2021-03-29 NOTE — Patient Instructions (Signed)
Carl Fernandez , Thank you for taking time to come for your Medicare Wellness Visit. I appreciate your ongoing commitment to your health goals. Please review the following plan we discussed and let me know if I can assist you in the future.   Screening recommendations/referrals: Colonoscopy: no longer required Recommended yearly ophthalmology/optometry visit for glaucoma screening and checkup Recommended yearly dental visit for hygiene and checkup  Vaccinations: Influenza vaccine: up to date Pneumococcal vaccine: Education provided Tdap vaccine: Education provided Shingles vaccine: Education provided    Advanced directives: Education provided  Conditions/risks identified:   Next appointment: 07-20-2021 LABS @ 9:00           08-03-2021 10:00 am [facility]  Preventive Care 80 Years and Older, Male Preventive care refers to lifestyle choices and visits with your health care provider that can promote health and wellness. What does preventive care include? A yearly physical exam. This is also called an annual well check. Dental exams once or twice a year. Routine eye exams. Ask your health care provider how often you should have your eyes checked. Personal lifestyle choices, including: Daily care of your teeth and gums. Regular physical activity. Eating a healthy diet. Avoiding tobacco and drug use. Limiting alcohol use. Practicing safe sex. Taking low doses of aspirin every day. Taking vitamin and mineral supplements as recommended by your health care provider. What happens during an annual well check? The services and screenings done by your health care provider during your annual well check will depend on your age, overall health, lifestyle risk factors, and family history of disease. Counseling  Your health care provider may ask you questions about your: Alcohol use. Tobacco use. Drug use. Emotional well-being. Home and relationship well-being. Sexual activity. Eating habits. History  of falls. Memory and ability to understand (cognition). Work and work Statistician. Screening  You may have the following tests or measurements: Height, weight, and BMI. Blood pressure. Lipid and cholesterol levels. These may be checked every 5 years, or more frequently if you are over 80 years old. Skin check. Lung cancer screening. You may have this screening every year starting at age 80 if you have a 30-pack-year history of smoking and currently smoke or have quit within the past 15 years. Fecal occult blood test (FOBT) of the stool. You may have this test every year starting at age 80. Flexible sigmoidoscopy or colonoscopy. You may have a sigmoidoscopy every 5 years or a colonoscopy every 10 years starting at age 80. Prostate cancer screening. Recommendations will vary depending on your family history and other risks. Hepatitis C blood test. Hepatitis B blood test. Sexually transmitted disease (STD) testing. Diabetes screening. This is done by checking your blood sugar (glucose) after you have not eaten for a while (fasting). You may have this done every 1-3 years. Abdominal aortic aneurysm (AAA) screening. You may need this if you are a current or former smoker. Osteoporosis. You may be screened starting at age 80 if you are at high risk. Talk with your health care provider about your test results, treatment options, and if necessary, the need for more tests. Vaccines  Your health care provider may recommend certain vaccines, such as: Influenza vaccine. This is recommended every year. Tetanus, diphtheria, and acellular pertussis (Tdap, Td) vaccine. You may need a Td booster every 10 years. Zoster vaccine. You may need this after age 71. Pneumococcal 13-valent conjugate (PCV13) vaccine. One dose is recommended after age 80. Pneumococcal polysaccharide (PPSV23) vaccine. One dose is recommended after age  80. Talk to your health care provider about which screenings and vaccines you need  and how often you need them. This information is not intended to replace advice given to you by your health care provider. Make sure you discuss any questions you have with your health care provider. Document Released: 05/22/2015 Document Revised: 01/13/2016 Document Reviewed: 02/24/2015 Elsevier Interactive Patient Education  2017 [place] Prevention in the Home Falls can cause injuries. They can happen to people of all ages. There are many things you can do to make your home safe and to help prevent falls. What can I do on the outside of my home? Regularly fix the edges of walkways and driveways and fix any cracks. Remove anything that might make you trip as you walk through a door, such as a raised step or threshold. Trim any bushes or trees on the path to your home. Use bright outdoor lighting. Clear any walking paths of anything that might make someone trip, such as rocks or tools. Regularly check to see if handrails are loose or broken. Make sure that both sides of any steps have handrails. Any raised decks and porches should have guardrails on the edges. Have any leaves, snow, or ice cleared regularly. Use sand or salt on walking paths during winter. Clean up any spills in your garage right away. This includes oil or grease spills. What can I do in the bathroom? Use night lights. Install grab bars by the toilet and in the tub and shower. Do not use towel bars as grab bars. Use non-skid mats or decals in the tub or shower. If you need to sit down in the shower, use a plastic, non-slip stool. Keep the floor dry. Clean up any water that spills on the floor as soon as it happens. Remove soap buildup in the tub or shower regularly. Attach bath mats securely with double-sided non-slip rug tape. Do not have throw rugs and other things on the floor that can make you trip. What can I do in the bedroom? Use night lights. Make sure that you have a light by your bed that is easy  to reach. Do not use any sheets or blankets that are too big for your bed. They should not hang down onto the floor. Have a firm chair that has side arms. You can use this for support while you get dressed. Do not have throw rugs and other things on the floor that can make you trip. What can I do in the kitchen? Clean up any spills right away. Avoid walking on wet floors. Keep items that you use a lot in easy-to-reach places. If you need to reach something above you, use a strong step stool that has a grab bar. Keep electrical cords out of the way. Do not use floor polish or wax that makes floors slippery. If you must use wax, use non-skid floor wax. Do not have throw rugs and other things on the floor that can make you trip. What can I do with my stairs? Do not leave any items on the stairs. Make sure that there are handrails on both sides of the stairs and use them. Fix handrails that are broken or loose. Make sure that handrails are as long as the stairways. Check any carpeting to make sure that it is firmly attached to the stairs. Fix any carpet that is loose or worn. Avoid having throw rugs at the top or bottom of the stairs. If you do have  throw rugs, attach them to the floor with carpet tape. Make sure that you have a light switch at the top of the stairs and the bottom of the stairs. If you do not have them, ask someone to add them for you. What else can I do to help prevent falls? Wear shoes that: Do not have high heels. Have rubber bottoms. Are comfortable and fit you well. Are closed at the toe. Do not wear sandals. If you use a stepladder: Make sure that it is fully opened. Do not climb a closed stepladder. Make sure that both sides of the stepladder are locked into place. Ask someone to hold it for you, if possible. Clearly mark and make sure that you can see: Any grab bars or handrails. First and last steps. Where the edge of each step is. Use tools that help you move  around (mobility aids) if they are needed. These include: Canes. Walkers. Scooters. Crutches. Turn on the lights when you go into a dark area. Replace any light bulbs as soon as they burn out. Set up your furniture so you have a clear path. Avoid moving your furniture around. If any of your floors are uneven, fix them. If there are any pets around you, be aware of where they are. Review your medicines with your doctor. Some medicines can make you feel dizzy. This can increase your chance of falling. Ask your doctor what other things that you can do to help prevent falls. This information is not intended to replace advice given to you by your health care provider. Make sure you discuss any questions you have with your health care provider. Document Released: 02/19/2009 Document Revised: 10/01/2015 Document Reviewed: 05/30/2014 Elsevier Interactive Patient Education  2017 [employer].

## 2021-04-12 ENCOUNTER — Other Ambulatory Visit: Payer: Self-pay | Admitting: Family Medicine

## 2021-04-12 DIAGNOSIS — K219 Gastro-esophageal reflux disease without esophagitis: Secondary | ICD-10-CM

## 2021-04-12 NOTE — Telephone Encounter (Signed)
Requested medication (s) are due for refill today: yes  Requested medication (s) are on the active medication list: yes  Last refill 03/15/21  Future visit scheduled: 07/20/21  Notes to clinic:  Historical Provider, please assess.    Requested Prescriptions  Pending Prescriptions Disp Refills   ADVAIR HFA 115-21 MCG/ACT inhaler [Pharmacy Med Name: ADVAIR HFA 115-21 MCG INHALER] 12 g 0    Sig: Inhale 2 inhalations into the lungs every 12 (twelve) hours     Pulmonology:  Combination Products Passed - 04/12/2021 12:22 PM      Passed - Valid encounter within last 12 months    Recent Outpatient Visits           2 months ago Need for influenza vaccination   [facility] [place], Avanti, MD   4 months ago Cough   [place], Avanti, MD   5 months ago Suspected COVID-19 virus infection   [facility] [place], [place], DO   1 year ago Essential hypertension   [place], [place], DO   1 year ago Essential hypertension   [place], [place], [place]       Future Appointments             In 3 months Vigg, Avanti, MD [facility], [facility]            Signed Prescriptions Disp Refills   omeprazole (PRILOSEC) 40 MG capsule 90 capsule 0    Sig: Take 1 capsule (40 mg total) by mouth daily.     Gastroenterology: Proton Pump Inhibitors Passed - 04/12/2021 12:22 PM      Passed - Valid encounter within last 12 months    Recent Outpatient Visits           2 months ago Need for influenza vaccination   [facility] Vigg, Avanti, MD   4 months ago Cough   [place], MD   5 months ago Suspected COVID-19 virus infection   [facility] [place], [place], DO   1 year ago Essential hypertension   [place], [place], DO   1 year ago Essential hypertension   [place], [place], [place]        Future Appointments             In 3 months Vigg, Avanti, MD [facility] [place], [place]

## 2021-04-12 NOTE — Telephone Encounter (Signed)
Requested Prescriptions  Pending Prescriptions Disp Refills  . omeprazole (PRILOSEC) 40 MG capsule [Pharmacy Med Name: OMEPRAZOLE DR 40 MG CAPSULE] 90 capsule 0    Sig: Take 1 capsule (40 mg total) by mouth daily.     Gastroenterology: Proton Pump Inhibitors Passed - 04/12/2021 12:22 PM      Passed - Valid encounter within last 12 months    Recent Outpatient Visits          2 months ago Need for influenza vaccination   [facility] Vigg, Avanti, MD   4 months ago Cough   [place], MD   5 months ago Suspected COVID-19 virus infection   [facility] [place], [place], DO   1 year ago Essential hypertension   [place], [place], DO   1 year ago Essential hypertension   [place], [place], [place]      Future Appointments            In 3 months Vigg, Avanti, MD [facility], [facility]           . [place] 115-21 MCG/ACT inhaler [Pharmacy Med Name: ADVAIR HFA 115-21 MCG INHALER] 12 g 0    Sig: Inhale 2 inhalations into the lungs every 12 (twelve) hours     Pulmonology:  Combination Products Passed - 04/12/2021 12:22 PM      Passed - Valid encounter within last 12 months    Recent Outpatient Visits          2 months ago Need for influenza vaccination   [facility] V[place], Avanti, MD   4 months ago Cough   [place], MD   5 months ago Suspected COVID-19 virus infection   [facility] [place], [place], DO   1 year ago Essential hypertension   [place], [place], DO   1 year ago Essential hypertension   [place], [place], [place]      Future Appointments            In 3 months Vigg, Avanti, MD [facility], [place]

## 2021-05-04 ENCOUNTER — Other Ambulatory Visit: Payer: Self-pay | Admitting: Internal Medicine

## 2021-05-04 DIAGNOSIS — J42 Unspecified chronic bronchitis: Secondary | ICD-10-CM

## 2021-05-05 NOTE — Telephone Encounter (Signed)
Requested Prescriptions  Pending Prescriptions Disp Refills   montelukast (SINGULAIR) 10 MG tablet [Pharmacy Med Name: MONTELUKAST SOD 10 MG TABLET] 90 tablet 0    Sig: Take 1 tablet (10 mg total) by mouth at bedtime.     Pulmonology:  Leukotriene Inhibitors Passed - 05/04/2021 12:24 PM      Passed - Valid encounter within last 12 months    Recent Outpatient Visits          3 months ago Need for influenza vaccination   [facility] [place], Avanti, MD   5 months ago Cough   [place], MD   6 months ago Suspected COVID-19 virus infection   [facility] [place], [place], DO   1 year ago Essential hypertension   [place], [place], DO   1 year ago Essential hypertension   [place], [place], [place]      Future Appointments            In 3 months Vigg, Avanti, MD [facility], [place]

## 2021-05-11 ENCOUNTER — Other Ambulatory Visit: Payer: Self-pay | Admitting: Family Medicine

## 2021-05-12 NOTE — Telephone Encounter (Signed)
Requested Prescriptions  Pending Prescriptions Disp Refills   ADVAIR HFA 115-21 MCG/ACT inhaler [Pharmacy Med Name: ADVAIR HFA 115-21 MCG INHALER] 12 g 3    Sig: Inhale 2 inhalations into the lungs every 12 (twelve) hours     Pulmonology:  Combination Products Passed - 05/11/2021 12:58 PM      Passed - Valid encounter within last 12 months    Recent Outpatient Visits          3 months ago Need for influenza vaccination   [place] Vigg, Avanti, MD   5 months ago Cough   [place], Avanti, MD   6 months ago Suspected COVID-19 virus infection   [facility] [place], [place], DO   1 year ago Essential hypertension   [place], [place], DO   1 year ago Essential hypertension   [place], [place], [place]      Future Appointments            In 2 months Vigg, Avanti, MD [facility], [place]

## 2021-06-11 ENCOUNTER — Telehealth: Payer: Self-pay | Admitting: Internal Medicine

## 2021-06-11 NOTE — Telephone Encounter (Signed)
Copied from [place] [id number]. Topic: Medicare AWV >> Jun 11, 2021 11:11 AM Lavonia Drafts wrote: Reason for CRM:  Left message for patient to call back and schedule the Medicare Annual Wellness Visit (AWV) virtually or by telephone.  Last AWV 03/27/20  Please schedule at anytime with [facility]-Nurse Health Advisor.  45 minute appointment  Any questions, please call me at [phone]

## 2021-06-22 ENCOUNTER — Ambulatory Visit (INDEPENDENT_AMBULATORY_CARE_PROVIDER_SITE_OTHER): Payer: Medicare Other | Admitting: *Deleted

## 2021-06-22 DIAGNOSIS — Z0000 Encounter for general adult medical examination without abnormal findings: Secondary | ICD-10-CM

## 2021-06-22 NOTE — Patient Instructions (Signed)
Carl Fernandez , Thank you for taking time to come for your Medicare Wellness Visit. I appreciate your ongoing commitment to your health goals. Please review the following plan we discussed and let me know if I can assist you in the future.   Screening recommendations/referrals: Colonoscopy: no longer required Recommended yearly ophthalmology/optometry visit for glaucoma screening and checkup Recommended yearly dental visit for hygiene and checkup  Vaccinations: Influenza vaccine: up to date Pneumococcal vaccine: up to date Tdap vaccine: up to date Shingles vaccine: up to date    Advanced directives: yes not on file  Conditions/risks identified:   Next appointment: 07-20-2021  @ 9:00 LAB   08-03-2021 @ 10:00  Vigg  Preventive Care 65 Years and Older, Male Preventive care refers to lifestyle choices and visits with your health care provider that can promote health and wellness. What does preventive care include? A yearly physical exam. This is also called an annual well check. Dental exams once or twice a year. Routine eye exams. Ask your health care provider how often you should have your eyes checked. Personal lifestyle choices, including: Daily care of your teeth and gums. Regular physical activity. Eating a healthy diet. Avoiding tobacco and drug use. Limiting alcohol use. Practicing safe sex. Taking low doses of aspirin every day. Taking vitamin and mineral supplements as recommended by your health care provider. What happens during an annual well check? The services and screenings done by your health care provider during your annual well check will depend on your age, overall health, lifestyle risk factors, and family history of disease. Counseling  Your health care provider may ask you questions about your: Alcohol use. Tobacco use. Drug use. Emotional well-being. Home and relationship well-being. Sexual activity. Eating habits. History of falls. Memory and ability to  understand (cognition). Work and work Statistician. Screening  You may have the following tests or measurements: Height, weight, and BMI. Blood pressure. Lipid and cholesterol levels. These may be checked every 5 years, or more frequently if you are over 68 years old. Skin check. Lung cancer screening. You may have this screening every year starting at age 1 if you have a 30-pack-year history of smoking and currently smoke or have quit within the past 15 years. Fecal occult blood test (FOBT) of the stool. You may have this test every year starting at age 44. Flexible sigmoidoscopy or colonoscopy. You may have a sigmoidoscopy every 5 years or a colonoscopy every 10 years starting at age 53. Prostate cancer screening. Recommendations will vary depending on your family history and other risks. Hepatitis C blood test. Hepatitis B blood test. Sexually transmitted disease (STD) testing. Diabetes screening. This is done by checking your blood sugar (glucose) after you have not eaten for a while (fasting). You may have this done every 1-3 years. Abdominal aortic aneurysm (AAA) screening. You may need this if you are a current or former smoker. Osteoporosis. You may be screened starting at age 46 if you are at high risk. Talk with your health care provider about your test results, treatment options, and if necessary, the need for more tests. Vaccines  Your health care provider may recommend certain vaccines, such as: Influenza vaccine. This is recommended every year. Tetanus, diphtheria, and acellular pertussis (Tdap, Td) vaccine. You may need a Td booster every 10 years. Zoster vaccine. You may need this after age 75. Pneumococcal 13-valent conjugate (PCV13) vaccine. One dose is recommended after age 9. Pneumococcal polysaccharide (PPSV23) vaccine. One dose is recommended after age 73.  Talk to your health care provider about which screenings and vaccines you need and how often you need them. This  information is not intended to replace advice given to you by your health care provider. Make sure you discuss any questions you have with your health care provider. Document Released: 05/22/2015 Document Revised: 01/13/2016 Document Reviewed: 02/24/2015 Elsevier Interactive Patient Education  2017 [place] Prevention in the Home Falls can cause injuries. They can happen to people of all ages. There are many things you can do to make your home safe and to help prevent falls. What can I do on the outside of my home? Regularly fix the edges of walkways and driveways and fix any cracks. Remove anything that might make you trip as you walk through a door, such as a raised step or threshold. Trim any bushes or trees on the path to your home. Use bright outdoor lighting. Clear any walking paths of anything that might make someone trip, such as rocks or tools. Regularly check to see if handrails are loose or broken. Make sure that both sides of any steps have handrails. Any raised decks and porches should have guardrails on the edges. Have any leaves, snow, or ice cleared regularly. Use sand or salt on walking paths during winter. Clean up any spills in your garage right away. This includes oil or grease spills. What can I do in the bathroom? Use night lights. Install grab bars by the toilet and in the tub and shower. Do not use towel bars as grab bars. Use non-skid mats or decals in the tub or shower. If you need to sit down in the shower, use a plastic, non-slip stool. Keep the floor dry. Clean up any water that spills on the floor as soon as it happens. Remove soap buildup in the tub or shower regularly. Attach bath mats securely with double-sided non-slip rug tape. Do not have throw rugs and other things on the floor that can make you trip. What can I do in the bedroom? Use night lights. Make sure that you have a light by your bed that is easy to reach. Do not use any sheets or  blankets that are too big for your bed. They should not hang down onto the floor. Have a firm chair that has side arms. You can use this for support while you get dressed. Do not have throw rugs and other things on the floor that can make you trip. What can I do in the kitchen? Clean up any spills right away. Avoid walking on wet floors. Keep items that you use a lot in easy-to-reach places. If you need to reach something above you, use a strong step stool that has a grab bar. Keep electrical cords out of the way. Do not use floor polish or wax that makes floors slippery. If you must use wax, use non-skid floor wax. Do not have throw rugs and other things on the floor that can make you trip. What can I do with my stairs? Do not leave any items on the stairs. Make sure that there are handrails on both sides of the stairs and use them. Fix handrails that are broken or loose. Make sure that handrails are as long as the stairways. Check any carpeting to make sure that it is firmly attached to the stairs. Fix any carpet that is loose or worn. Avoid having throw rugs at the top or bottom of the stairs. If you do have throw  rugs, attach them to the floor with carpet tape. Make sure that you have a light switch at the top of the stairs and the bottom of the stairs. If you do not have them, ask someone to add them for you. What else can I do to help prevent falls? Wear shoes that: Do not have high heels. Have rubber bottoms. Are comfortable and fit you well. Are closed at the toe. Do not wear sandals. If you use a stepladder: Make sure that it is fully opened. Do not climb a closed stepladder. Make sure that both sides of the stepladder are locked into place. Ask someone to hold it for you, if possible. Clearly mark and make sure that you can see: Any grab bars or handrails. First and last steps. Where the edge of each step is. Use tools that help you move around (mobility aids) if they are  needed. These include: Canes. Walkers. Scooters. Crutches. Turn on the lights when you go into a dark area. Replace any light bulbs as soon as they burn out. Set up your furniture so you have a clear path. Avoid moving your furniture around. If any of your floors are uneven, fix them. If there are any pets around you, be aware of where they are. Review your medicines with your doctor. Some medicines can make you feel dizzy. This can increase your chance of falling. Ask your doctor what other things that you can do to help prevent falls. This information is not intended to replace advice given to you by your health care provider. Make sure you discuss any questions you have with your health care provider. Document Released: 02/19/2009 Document Revised: 10/01/2015 Document Reviewed: 05/30/2014 Elsevier Interactive Patient Education  2017 [employer].

## 2021-06-22 NOTE — Progress Notes (Signed)
Subjective:   I connected with  [name] on 06/22/21 by a telephone enabled telemedicine application and verified that I am speaking with the correct person using two identifiers.   I discussed the limitations of evaluation and management by telemedicine. The patient expressed understanding and agreed to proceed.  Patient location: home  Provider location: Tele-Health not in office     Review of Systems     Cardiac Risk Factors include: advanced age (>13men, >15 women);hypertension;male gender     Objective:    Today's Vitals   06/22/21 1032  PainSc: 3    There is no height or weight on file to calculate BMI.  Advanced Directives 06/22/2021 03/29/2021 03/27/2020 03/25/2019 03/22/2018 01/26/2017  Does Patient Have a Medical Advance Directive? Yes No Yes Yes Yes Yes  Type of Advance Directive Living will;Healthcare Power of [place];Living will Living will;Healthcare Power of [place];Living will [place];Living will  Does patient want to make changes to medical advance directive? - - - - No - Patient declined -  Copy of [place] in Chart? No - copy requested - No - copy requested No - copy requested No - copy requested No - copy requested  Would patient like information on creating a medical advance directive? - No - Patient declined - - - -    Current Medications (verified) Outpatient Encounter Medications as of 06/22/2021  Medication Sig   ADVAIR HFA 115-21 MCG/ACT inhaler Inhale 2 inhalations into the lungs every 12 (twelve) hours   albuterol (VENTOLIN HFA) 108 (90 Base) MCG/ACT inhaler Inhale 2 puffs into the lungs every 6 (six) hours as needed for wheezing or shortness of breath.   atorvastatin (LIPITOR) 40 MG tablet Take 1 tablet (40 mg total) by mouth daily.   benazepril (LOTENSIN) 20 MG tablet Take 1 tablet (20 mg total) by mouth daily.   montelukast (SINGULAIR) 10  MG tablet Take 1 tablet (10 mg total) by mouth at bedtime.   omeprazole (PRILOSEC) 40 MG capsule Take 1 capsule (40 mg total) by mouth daily. (Patient not taking: Reported on 06/22/2021)   No facility-administered encounter medications on file as of 06/22/2021.    Allergies (verified) Amoxicillin and Penicillin g   History: Past Medical History:  Diagnosis Date   COPD (chronic obstructive pulmonary disease) (HCC)    Diverticulitis    GERD (gastroesophageal reflux disease)    Hard of hearing    Kidney stones    OSA on CPAP    Plantar fasciitis    Past Surgical History:  Procedure Laterality Date   LITHOTRIPSY     nasoplasty     Family History  Problem Relation Age of Onset   Hypertension Mother    Osteoporosis Mother    Cancer Mother        skin on her nose   Heart disease Father    Social History   Socioeconomic History   Marital status: Married    Spouse name: Not on file   Number of children: Not on file   Years of education: Not on file   Highest education level: Not on file  Occupational History   Not on file  Tobacco Use   Smoking status: Never   Smokeless tobacco: Never  Vaping Use   Vaping Use: Never used  Substance and Sexual Activity   Alcohol use: No   Drug use: No   Sexual activity: Not Currently  Other Topics  Concern   Not on file  Social History Narrative   Not on file   Social Determinants of Health   Financial Resource Strain: Low Risk    Difficulty of Paying Living Expenses: Not hard at all  Food Insecurity: No Food Insecurity   Worried About Charity fundraiser in the Last Year: Never true   [place] in the Last Year: Never true  Transportation Needs: No Transportation Needs   Lack of Transportation (Medical): No   Lack of Transportation (Non-Medical): No  Physical Activity: Inactive   Days of Exercise per Week: 0 days   Minutes of Exercise per Session: 0 min  Stress: No Stress Concern Present   Feeling of Stress : Not at  all  Social Connections: Moderately Integrated   Frequency of Communication with Friends and Family: More than three times a week   Frequency of Social Gatherings with Friends and Family: Three times a week   Attends Religious Services: More than 4 times per year   Active Member of [place] or Organizations: No   Attends Archivist Meetings: Never   Marital Status: Married    Tobacco Counseling Counseling given: Not Answered   Clinical Intake:  Pre-visit preparation completed: Yes  Pain : 0-10 Pain Score: 3  Pain Type: Chronic pain, Acute pain Pain Location: Other (Comment) (slammed finger thumb right) Pain Orientation: Right Pain Descriptors / Indicators: Aching Pain Onset: Yesterday Pain Frequency: Intermittent Pain Relieving Factors: tylenol  Pain Relieving Factors: tylenol  Nutritional Risks: None Diabetes: No  How often do you need to have someone help you when you read instructions, pamphlets, or other written materials from your doctor or pharmacy?: 1 - Never  Diabetic?  no  Interpreter Needed?: No  Information entered by :: Leroy Kennedy LPN   Activities of Daily Living In your present state of health, do you have any difficulty performing the following activities: 06/22/2021 03/29/2021  Hearing? Tempie Donning  Vision? N N  Difficulty concentrating or making decisions? N N  Walking or climbing stairs? N N  Dressing or bathing? N N  Doing errands, shopping? N N  Preparing Food and eating ? N N  Using the Toilet? N N  In the past six months, have you accidently leaked urine? N N  Do you have problems with loss of bowel control? N N  Managing your Medications? N N  Managing your Finances? N N  Housekeeping or managing your Housekeeping? N N  Some recent data might be hidden    Patient Care Team: Charlynne Cousins, MD as PCP - General Erby Pian, MD as Referring Physician (Specialist) Dasher, Rayvon Char, MD (Dermatology) Manya Silvas, MD (Inactive)  (Gastroenterology)  Indicate any recent Medical Services you may have received from other than [facility] providers in the past year (date may be approximate).     Assessment:   This is a routine wellness examination for Otho.  Hearing/Vision screen Hearing Screening - Comments:: Bilateral hearing aids Vision Screening - Comments:: Up to date [place]  Dietary issues and exercise activities discussed: Current Exercise Habits: The patient does not participate in regular exercise at present, Exercise limited by: None identified   Goals Addressed             This Visit's Progress    Patient Stated       Continue current lifestyle       Depression Screen [facility] 2/9 Scores 06/22/2021 03/29/2021 11/03/2020 03/27/2020 03/25/2020 03/25/2019 03/22/2018  PHQ - 2 Score 0 0 0 0 0 0 0    Fall Risk Fall Risk  06/22/2021 03/29/2021 02/03/2021 11/24/2020 11/03/2020  Falls in the past year? 0 0 0 0 0  Number falls in past yr: 0 0 0 0 0  Injury with Fall? 0 0 0 0 0  Risk for fall due to : - - No Fall Risks No Fall Risks No Fall Risks  Follow up Falls evaluation completed;Falls prevention discussed;Education provided Falls evaluation completed;Falls prevention discussed Falls evaluation completed Falls evaluation completed Falls evaluation completed    FALL RISK PREVENTION PERTAINING TO THE HOME:  Any stairs in or around the home? Yes  If so, are there any without handrails? No  Home free of loose throw rugs in walkways, pet beds, electrical cords, etc? Yes  Adequate lighting in your home to reduce risk of falls? Yes   ASSISTIVE DEVICES UTILIZED TO PREVENT FALLS:  Life alert? No  Use of a cane, walker or w/c? No  Grab bars in the bathroom? Yes  Shower chair or bench in shower? No  Elevated toilet seat or a handicapped toilet? Yes   TIMED UP AND GO:  Was the test performed? No .    Cognitive Function:  Normal cognitive status assessed by direct observation by this Nurse Health  Advisor. No abnormalities found.       6CIT Screen 03/29/2021 03/27/2020 03/25/2019 03/22/2018 01/26/2017  What Year? 0 points 0 points 0 points 0 points 0 points  What month? 0 points 0 points 0 points 0 points 0 points  What time? 0 points 0 points 0 points 0 points 0 points  Count back from 20 0 points 0 points 0 points 0 points 0 points  Months in reverse 0 points 0 points 0 points 0 points 0 points  Repeat phrase 0 points 0 points 0 points 0 points 0 points  Total Score 0 0 0 0 0    Immunizations Immunization History  Administered Date(s) Administered   Fluad Quad(high Dose 65+) 02/03/2021   Influenza, High Dose Seasonal PF 01/26/2016, 01/26/2017, 03/22/2018   Influenza,inj,Quad PF,6+ Mos 04/29/2015   Influenza-Unspecified 01/07/2014, 01/10/2019, 02/12/2020   PFIZER(Purple Top)SARS-COV-2 Vaccination 05/29/2019, 06/22/2019, 02/12/2020   Pneumococcal Conjugate-13 04/29/2015   Pneumococcal Polysaccharide-23 01/26/2017   Tdap 10/09/2013   Zoster Recombinat (Shingrix) 06/21/2018, 10/09/2018, 01/11/2019    TDAP status: Up to date  Flu Vaccine status: Up to date  Pneumococcal vaccine status: Up to date  Covid-19 vaccine status: Information provided on how to obtain vaccines.   Qualifies for Shingles Vaccine? No   Zostavax completed No   Shingrix Completed?: Yes  Screening Tests Health Maintenance  Topic Date Due   COVID-19 Vaccine (4 - Booster for Pfizer series) 04/08/2020   TETANUS/TDAP  10/10/2023   Pneumonia Vaccine 12+ Years old  Completed   INFLUENZA VACCINE  Completed   Zoster Vaccines- Shingrix  Completed   HPV VACCINES  Aged Out    Health Maintenance  Health Maintenance Due  Topic Date Due   COVID-19 Vaccine (4 - Booster for Pfizer series) 04/08/2020    Colorectal cancer screening: No longer required.   Lung Cancer Screening: (Low Dose CT Chest recommended if Age 65-80 years, 30 pack-year currently smoking OR have quit w/in 15years.)   Lung Cancer  Screening Referral:   Additional Screening:  Hepatitis C Screening: does not qualify; Completed 2018  Vision Screening: Recommended annual ophthalmology exams for early detection of glaucoma and other disorders of the eye.  Is the patient up to date with their annual eye exam?  Yes  Who is the provider or what is the name of the office in which the patient attends annual eye exams? [facility] If pt is not established with a provider, would they like to be referred to a provider to establish care? No .   Dental Screening: Recommended annual dental exams for proper oral hygiene  Community Resource Referral / Chronic Care Management: CRR required this visit?  No   CCM required this visit?  No      Plan:     I have personally reviewed and noted the following in the patients chart:   Medical and social history Use of alcohol, tobacco or illicit drugs  Current medications and supplements including opioid prescriptions. Patient is not currently taking opioid prescriptions. Functional ability and status Nutritional status Physical activity Advanced directives List of other physicians Hospitalizations, surgeries, and ER visits in previous 12 months Vitals Screenings to include cognitive, depression, and falls Referrals and appointments  In addition, I have reviewed and discussed with patient certain preventive protocols, quality metrics, and best practice recommendations. A written personalized care plan for preventive services as well as general preventive health recommendations were provided to patient.     Leroy Kennedy, LPN   [id number]   Nurse Notes:

## 2021-07-01 NOTE — Progress Notes (Signed)
I, as supervising physician, have reviewed the nurse health advisor's Medicare Wellness Visit note for this patient and concur with the findings and recommendations listed above.   

## 2021-07-09 ENCOUNTER — Other Ambulatory Visit: Payer: Self-pay | Admitting: Internal Medicine

## 2021-07-09 DIAGNOSIS — K219 Gastro-esophageal reflux disease without esophagitis: Secondary | ICD-10-CM

## 2021-07-09 NOTE — Telephone Encounter (Signed)
Requested Prescriptions  ?Pending Prescriptions Disp Refills  ?? omeprazole (PRILOSEC) 40 MG capsule [Pharmacy Med Name: OMEPRAZOLE DR 40 MG CAPSULE] 90 capsule 0  ?  Sig: Take 1 capsule (40 mg total) by mouth daily.  ?  ? Gastroenterology: Proton Pump Inhibitors Passed - 07/09/2021  1:34 PM  ?  ?  Passed - Valid encounter within last 12 months  ?  Recent Outpatient Visits   ?      ? 5 months ago Need for influenza vaccination  ? [facility] Vigg, Avanti, MD  ? 7 months ago Cough  ? [facility] Vigg, Avanti, MD  ? 8 months ago Suspected COVID-19 virus infection  ? [place], Megan P, DO  ? 1 year ago Essential hypertension  ? [place], Megan P, DO  ? 1 year ago Essential hypertension  ? [place], [place], [place]  ?  ?  ?Future Appointments   ?        ? In 3 weeks Vigg, Avanti, MD [facility]  ?  ? ?  ?  ?  ? ? ?

## 2021-07-14 DIAGNOSIS — D485 Neoplasm of uncertain behavior of skin: Secondary | ICD-10-CM | POA: Diagnosis not present

## 2021-07-14 DIAGNOSIS — L570 Actinic keratosis: Secondary | ICD-10-CM | POA: Diagnosis not present

## 2021-07-14 DIAGNOSIS — D225 Melanocytic nevi of trunk: Secondary | ICD-10-CM | POA: Diagnosis not present

## 2021-07-14 DIAGNOSIS — D2261 Melanocytic nevi of right upper limb, including shoulder: Secondary | ICD-10-CM | POA: Diagnosis not present

## 2021-07-14 DIAGNOSIS — L538 Other specified erythematous conditions: Secondary | ICD-10-CM | POA: Diagnosis not present

## 2021-07-14 DIAGNOSIS — L820 Inflamed seborrheic keratosis: Secondary | ICD-10-CM | POA: Diagnosis not present

## 2021-07-14 DIAGNOSIS — L821 Other seborrheic keratosis: Secondary | ICD-10-CM | POA: Diagnosis not present

## 2021-07-14 DIAGNOSIS — Z85828 Personal history of other malignant neoplasm of skin: Secondary | ICD-10-CM | POA: Diagnosis not present

## 2021-07-14 DIAGNOSIS — D0472 Carcinoma in situ of skin of left lower limb, including hip: Secondary | ICD-10-CM | POA: Diagnosis not present

## 2021-07-19 ENCOUNTER — Other Ambulatory Visit: Payer: Self-pay | Admitting: Family Medicine

## 2021-07-19 ENCOUNTER — Other Ambulatory Visit: Payer: Self-pay | Admitting: Internal Medicine

## 2021-07-19 DIAGNOSIS — J309 Allergic rhinitis, unspecified: Secondary | ICD-10-CM

## 2021-07-20 ENCOUNTER — Other Ambulatory Visit: Payer: Self-pay

## 2021-07-20 ENCOUNTER — Other Ambulatory Visit: Payer: Medicare Other

## 2021-07-20 DIAGNOSIS — I10 Essential (primary) hypertension: Secondary | ICD-10-CM

## 2021-07-20 NOTE — Telephone Encounter (Signed)
D/C  02/03/21. ?Requested Prescriptions  ?Refused Prescriptions Disp Refills  ?? fluticasone (FLONASE) 50 MCG/ACT nasal spray [Pharmacy Med Name: FLUTICASONE PROP 50 MCG SPRAY] 48 g 0  ?  Sig: Place 2 sprays into both nostrils daily.  ?  ? Not Delegated - Ear, Nose, and Throat: Nasal Preparations - Corticosteroids Failed - 07/19/2021 11:01 AM  ?  ?  Failed - This refill cannot be delegated  ?  ?  Passed - Valid encounter within last 12 months  ?  Recent Outpatient Visits   ?      ? 5 months ago Need for influenza vaccination  ? [facility] Vigg, Avanti, MD  ? 7 months ago Cough  ? [facility] Vigg, Avanti, MD  ? 8 months ago Suspected COVID-19 virus infection  ? [place], Megan P, DO  ? 1 year ago Essential hypertension  ? [place], Megan P, DO  ? 1 year ago Essential hypertension  ? [place], [place], [place]  ?  ?  ?Future Appointments   ?        ? In 2 weeks Vigg, Avanti, MD [facility]  ?  ? ?  ?  ?  ? ?

## 2021-07-20 NOTE — Telephone Encounter (Signed)
Requested Prescriptions  ?Pending Prescriptions Disp Refills  ?? atorvastatin (LIPITOR) 40 MG tablet [Pharmacy Med Name: ATORVASTATIN 40 MG TABLET] 30 tablet 0  ?  Sig: Take 1 tablet (40 mg total) by mouth daily.  ?  ? Cardiovascular:  Antilipid - Statins Failed - 07/19/2021 11:50 AM  ?  ?  Failed - Lipid Panel in normal range within the last 12 months  ?  Cholesterol, Total  ?Date Value Ref Range Status  ?12/29/2020 206 (H) 100 - 199 mg/dL Final  ? ?LDL Chol Calc (NIH)  ?Date Value Ref Range Status  ?12/29/2020 142 (H) 0 - 99 mg/dL Final  ? ?HDL  ?Date Value Ref Range Status  ?12/29/2020 41 >39 mg/dL Final  ? ?Triglycerides  ?Date Value Ref Range Status  ?12/29/2020 125 0 - 149 mg/dL Final  ? ?  ?  ?  Passed - Patient is not pregnant  ?  ?  Passed - Valid encounter within last 12 months  ?  Recent Outpatient Visits   ?      ? 5 months ago Need for influenza vaccination  ? [facility] Vigg, Avanti, MD  ? 7 months ago Cough  ? [facility] Vigg, Avanti, MD  ? 8 months ago Suspected COVID-19 virus infection  ? [place], Megan P, DO  ? 1 year ago Essential hypertension  ? [place], Megan P, DO  ? 1 year ago Essential hypertension  ? [place], [place], [place]  ?  ?  ?Future Appointments   ?        ? In 2 weeks Vigg, Avanti, MD [facility]  ?  ? ?  ?  ?  ? ?

## 2021-07-21 DIAGNOSIS — J4520 Mild intermittent asthma, uncomplicated: Secondary | ICD-10-CM | POA: Diagnosis not present

## 2021-07-21 DIAGNOSIS — G4733 Obstructive sleep apnea (adult) (pediatric): Secondary | ICD-10-CM | POA: Diagnosis not present

## 2021-07-21 DIAGNOSIS — J310 Chronic rhinitis: Secondary | ICD-10-CM | POA: Diagnosis not present

## 2021-07-21 LAB — CBC WITH DIFFERENTIAL/PLATELET
Basophils Absolute: 0 10*3/uL (ref 0.0–0.2)
Basos: 1 %
EOS (ABSOLUTE): 0.2 10*3/uL (ref 0.0–0.4)
Eos: 3 %
Hematocrit: 40.9 % (ref 37.5–51.0)
Hemoglobin: 13.8 g/dL (ref 13.0–17.7)
Immature Grans (Abs): 0 10*3/uL (ref 0.0–0.1)
Immature Granulocytes: 0 %
Lymphocytes Absolute: 1.1 10*3/uL (ref 0.7–3.1)
Lymphs: 17 %
MCH: 29.2 pg (ref 26.6–33.0)
MCHC: 33.7 g/dL (ref 31.5–35.7)
MCV: 87 fL (ref 79–97)
Monocytes Absolute: 0.5 10*3/uL (ref 0.1–0.9)
Monocytes: 7 %
Neutrophils Absolute: 4.7 10*3/uL (ref 1.4–7.0)
Neutrophils: 72 %
Platelets: 199 10*3/uL (ref 150–450)
RBC: 4.73 x10E6/uL (ref 4.14–5.80)
RDW: 12.3 % (ref 11.6–15.4)
WBC: 6.5 10*3/uL (ref 3.4–10.8)

## 2021-07-21 LAB — COMPREHENSIVE METABOLIC PANEL
ALT: 14 IU/L (ref 0–44)
AST: 17 IU/L (ref 0–40)
Albumin/Globulin Ratio: 2.1 (ref 1.2–2.2)
Albumin: 4.2 g/dL (ref 3.6–4.6)
Alkaline Phosphatase: 92 IU/L (ref 44–121)
BUN/Creatinine Ratio: 10 (ref 10–24)
BUN: 13 mg/dL (ref 8–27)
Bilirubin Total: 0.7 mg/dL (ref 0.0–1.2)
CO2: 25 mmol/L (ref 20–29)
Calcium: 8.9 mg/dL (ref 8.6–10.2)
Chloride: 106 mmol/L (ref 96–106)
Creatinine, Ser: 1.25 mg/dL (ref 0.76–1.27)
Globulin, Total: 2 g/dL (ref 1.5–4.5)
Glucose: 94 mg/dL (ref 70–99)
Potassium: 4.1 mmol/L (ref 3.5–5.2)
Sodium: 143 mmol/L (ref 134–144)
Total Protein: 6.2 g/dL (ref 6.0–8.5)
eGFR: 58 mL/min/{1.73_m2} — ABNORMAL LOW (ref >59)

## 2021-07-21 LAB — LIPID PANEL
Chol/HDL Ratio: 3.1 ratio (ref 0.0–5.0)
Cholesterol, Total: 107 mg/dL (ref 100–199)
HDL: 34 mg/dL — ABNORMAL LOW (ref >39)
LDL Chol Calc (NIH): 55 mg/dL (ref 0–99)
Triglycerides: 90 mg/dL (ref 0–149)
VLDL Cholesterol Cal: 18 mg/dL (ref 5–40)

## 2021-07-21 LAB — TSH: TSH: 2.79 u[IU]/mL (ref 0.450–4.500)

## 2021-08-03 ENCOUNTER — Ambulatory Visit (INDEPENDENT_AMBULATORY_CARE_PROVIDER_SITE_OTHER): Payer: Medicare Other | Admitting: Internal Medicine

## 2021-08-03 ENCOUNTER — Other Ambulatory Visit: Payer: Self-pay

## 2021-08-03 ENCOUNTER — Encounter: Payer: Self-pay | Admitting: Internal Medicine

## 2021-08-03 VITALS — BP 127/74 | HR 67 | Temp 97.5°F | Ht 68.74 in | Wt 193.4 lb

## 2021-08-03 DIAGNOSIS — Z9989 Dependence on other enabling machines and devices: Secondary | ICD-10-CM | POA: Diagnosis not present

## 2021-08-03 DIAGNOSIS — G4733 Obstructive sleep apnea (adult) (pediatric): Secondary | ICD-10-CM

## 2021-08-03 DIAGNOSIS — I10 Essential (primary) hypertension: Secondary | ICD-10-CM

## 2021-08-03 DIAGNOSIS — E7800 Pure hypercholesterolemia, unspecified: Secondary | ICD-10-CM | POA: Diagnosis not present

## 2021-08-03 DIAGNOSIS — J42 Unspecified chronic bronchitis: Secondary | ICD-10-CM

## 2021-08-03 NOTE — Progress Notes (Signed)
? ?BP 127/74   Pulse 67   Temp (!) 97.5 ?F (36.4 ?C) (Oral)   Ht 5' 8.74" (1.746 m)   Wt 193 lb 6.4 oz (87.7 kg)   SpO2 97%   BMI 28.78 kg/m?   ? ?Subjective:  ? ? Patient ID: Carl Fernandez, male    DOB: Jan 23, 1941, 81 y.o.   MRN: [id number] ? ?Chief Complaint  ?Patient presents with  ? Hypertension  ? Hyperlipidemia  ? COPD  ? Gastroesophageal Reflux  ? ? ?HPI: ?Carl Fernandez is a 81 y.o. male ? ?Hypertension ?This is a chronic problem. The current episode started more than 1 month ago. The problem is controlled. Pertinent negatives include no chest pain, headaches, malaise/fatigue, neck pain, orthopnea, palpitations or shortness of breath.  ?Hyperlipidemia ?This is a chronic problem. The problem is controlled. Pertinent negatives include no chest pain, myalgias or shortness of breath.  ?COPD ?There is no chest tightness, cough, difficulty breathing, frequent throat clearing, hemoptysis, hoarse voice, shortness of breath, sputum production or wheezing. This is a chronic problem. Pertinent negatives include no appetite change, chest pain, ear pain, fever, headaches, malaise/fatigue or myalgias. His past medical history is significant for COPD.  ?Gastroesophageal Reflux ?He reports no abdominal pain, no chest pain, no coughing, no hoarse voice, no nausea or no wheezing. Pertinent negatives include no fatigue.  ? ?Chief Complaint  ?Patient presents with  ? Hypertension  ? Hyperlipidemia  ? COPD  ? Gastroesophageal Reflux  ? ? ?Relevant past medical, surgical, family and social history reviewed and updated as indicated. Interim medical history since our last visit reviewed. ?Allergies and medications reviewed and updated. ? ?Review of Systems  ?Constitutional:  Negative for activity change, appetite change, chills, fatigue, fever and malaise/fatigue.  ?HENT:  Negative for congestion, ear discharge, ear pain, facial swelling and hoarse voice.   ?Eyes:  Negative for pain, discharge and itching.  ?Respiratory:   Negative for cough, hemoptysis, sputum production, chest tightness, shortness of breath and wheezing.   ?Cardiovascular:  Negative for chest pain, palpitations, orthopnea and leg swelling.  ?Gastrointestinal:  Negative for abdominal distention, abdominal pain, blood in stool, constipation, diarrhea, nausea and vomiting.  ?Endocrine: Negative for cold intolerance, heat intolerance, polydipsia, polyphagia and polyuria.  ?Genitourinary:  Negative for difficulty urinating, dysuria, flank pain, frequency, hematuria and urgency.  ?Musculoskeletal:  Negative for arthralgias, gait problem, joint swelling, myalgias and neck pain.  ?Skin:  Negative for color change, rash and wound.  ?Neurological:  Negative for dizziness, tremors, speech difficulty, weakness, light-headedness, numbness and headaches.  ?Hematological:  Does not bruise/bleed easily.  ?Psychiatric/Behavioral:  Negative for agitation, confusion, decreased concentration, sleep disturbance and suicidal ideas.   ? ?Per HPI unless specifically indicated above ? ?   ?Objective:  ?  ?BP 127/74   Pulse 67   Temp (!) 97.5 ?F (36.4 ?C) (Oral)   Ht 5' 8.74" (1.746 m)   Wt 193 lb 6.4 oz (87.7 kg)   SpO2 97%   BMI 28.78 kg/m?   ?Wt Readings from Last 3 Encounters:  ?08/03/21 193 lb 6.4 oz (87.7 kg)  ?02/03/21 192 lb 3.2 oz (87.2 kg)  ?11/24/20 188 lb 9.6 oz (85.5 kg)  ?  ?Physical Exam ?Vitals and nursing note reviewed.  ?Constitutional:   ?   General: He is not in acute distress. ?   Appearance: Normal appearance. He is not ill-appearing or diaphoretic.  ?HENT:  ?   Head: Normocephalic and atraumatic.  ?   Right Ear: Tympanic membrane and  external ear normal. There is no impacted cerumen.  ?   Left Ear: External ear normal.  ?   Nose: No congestion or rhinorrhea.  ?   Mouth/Throat:  ?   Pharynx: No oropharyngeal exudate or posterior oropharyngeal erythema.  ?Eyes:  ?   Conjunctiva/sclera: Conjunctivae normal.  ?   Pupils: Pupils are equal, round, and reactive to  light.  ?Cardiovascular:  ?   Rate and Rhythm: Normal rate and regular rhythm.  ?   Heart sounds: No murmur heard. ?  No friction rub. No gallop.  ?Pulmonary:  ?   Effort: No respiratory distress.  ?   Breath sounds: No stridor. No wheezing or rhonchi.  ?Chest:  ?   Chest wall: No tenderness.  ?Abdominal:  ?   General: Abdomen is flat. Bowel sounds are normal.  ?   Palpations: Abdomen is soft. There is no mass.  ?   Tenderness: There is no abdominal tenderness.  ?Musculoskeletal:  ?   Cervical back: Normal range of motion and neck supple. No rigidity or tenderness.  ?   Left lower leg: No edema.  ?Skin: ?   General: Skin is warm and dry.  ?Neurological:  ?   Mental Status: He is alert.  ? ? ?Results for orders placed or performed in visit on 07/20/21  ?Comp Met (CMET)  ?Result Value Ref Range  ? Glucose 94 70 - 99 mg/dL  ? BUN 13 8 - 27 mg/dL  ? Creatinine, Ser 1.25 0.76 - 1.27 mg/dL  ? eGFR 58 (L) >59 mL/min/1.73  ? BUN/Creatinine Ratio 10 10 - 24  ? Sodium 143 134 - 144 mmol/L  ? Potassium 4.1 3.5 - 5.2 mmol/L  ? Chloride 106 96 - 106 mmol/L  ? CO2 25 20 - 29 mmol/L  ? Calcium 8.9 8.6 - 10.2 mg/dL  ? Total Protein 6.2 6.0 - 8.5 g/dL  ? Albumin 4.2 3.6 - 4.6 g/dL  ? Globulin, Total 2.0 1.5 - 4.5 g/dL  ? Albumin/Globulin Ratio 2.1 1.2 - 2.2  ? Bilirubin Total 0.7 0.0 - 1.2 mg/dL  ? Alkaline Phosphatase 92 44 - 121 IU/L  ? AST 17 0 - 40 IU/L  ? ALT 14 0 - 44 IU/L  ?TSH  ?Result Value Ref Range  ? TSH 2.790 0.450 - 4.500 uIU/mL  ?CBC with Differential/Platelet  ?Result Value Ref Range  ? WBC 6.5 3.4 - 10.8 x10E3/uL  ? RBC 4.73 4.14 - 5.80 x10E6/uL  ? Hemoglobin 13.8 13.0 - 17.7 g/dL  ? Hematocrit 40.9 37.5 - 51.0 %  ? MCV 87 79 - 97 fL  ? MCH 29.2 26.6 - 33.0 pg  ? MCHC 33.7 31.5 - 35.7 g/dL  ? RDW 12.3 11.6 - 15.4 %  ? Platelets 199 150 - 450 x10E3/uL  ? Neutrophils 72 Not Estab. %  ? Lymphs 17 Not Estab. %  ? Monocytes 7 Not Estab. %  ? Eos 3 Not Estab. %  ? Basos 1 Not Estab. %  ? Neutrophils Absolute 4.7 1.4 - 7.0  x10E3/uL  ? Lymphocytes Absolute 1.1 0.7 - 3.1 x10E3/uL  ? Monocytes Absolute 0.5 0.1 - 0.9 x10E3/uL  ? EOS (ABSOLUTE) 0.2 0.0 - 0.4 x10E3/uL  ? Basophils Absolute 0.0 0.0 - 0.2 x10E3/uL  ? Immature Granulocytes 0 Not Estab. %  ? Immature Grans (Abs) 0.0 0.0 - 0.1 x10E3/uL  ?Lipid panel  ?Result Value Ref Range  ? Cholesterol, Total 107 100 - 199 mg/dL  ? Triglycerides 90  0 - 149 mg/dL  ? HDL 34 (L) >39 mg/dL  ? VLDL Cholesterol Cal 18 5 - 40 mg/dL  ? LDL Chol Calc (NIH) 55 0 - 99 mg/dL  ? Chol/HDL Ratio 3.1 0.0 - 5.0 ratio  ? ?   ? ? ?Current Outpatient Medications:  ?  ADVAIR HFA 115-21 MCG/ACT inhaler, Inhale 2 inhalations into the lungs every 12 (twelve) hours, Disp: 12 g, Rfl: 3 ?  albuterol (VENTOLIN HFA) 108 (90 Base) MCG/ACT inhaler, Inhale 2 puffs into the lungs every 6 (six) hours as needed for wheezing or shortness of breath., Disp: 18 g, Rfl: 1 ?  atorvastatin (LIPITOR) 40 MG tablet, Take 1 tablet (40 mg total) by mouth daily., Disp: 30 tablet, Rfl: 0 ?  benazepril (LOTENSIN) 20 MG tablet, Take 1 tablet (20 mg total) by mouth daily., Disp: 30 tablet, Rfl: 4 ?  montelukast (SINGULAIR) 10 MG tablet, Take 1 tablet (10 mg total) by mouth at bedtime., Disp: 90 tablet, Rfl: 2 ?  omeprazole (PRILOSEC) 40 MG capsule, Take 1 capsule (40 mg total) by mouth daily., Disp: 90 capsule, Rfl: 0  ? ? ?Assessment & Plan:  ?HLD is on lipitor 40 mg for such  ?recheck FLP, check LFT's work on diet, SE of meds explained to pt. low fat and high fiber diet explained to pt. ? ?  ?2. HTN bp meds were cut back last visit is on benazapril 20 mg  ?Continue current meds.  Medication compliance emphasised. pt advised to keep Bp logs. Pt verbalised understanding of the same. Pt to have a low salt diet . Exercise to reach a goal of at least 150 mins a week.  lifestyle modifications explained and pt understands importance of the above. ?  ?3. COPD is on singulair and advair bid. for such continue.  ?Chronic stable  ?No cough now.   ?Continue alllegra ?  ?4. Nocturia will check PSA last checked 2018  ? ? ?5. OSA is on Cpap every evening.  ? ?Problem List Items Addressed This Visit   ? ?  ? Cardiovascular and Mediastinum  ? Essential hypertension  ?  ? Resp

## 2021-08-05 DIAGNOSIS — Z20822 Contact with and (suspected) exposure to covid-19: Secondary | ICD-10-CM | POA: Diagnosis not present

## 2021-08-10 DIAGNOSIS — Z20822 Contact with and (suspected) exposure to covid-19: Secondary | ICD-10-CM | POA: Diagnosis not present

## 2021-08-16 ENCOUNTER — Other Ambulatory Visit: Payer: Self-pay | Admitting: Internal Medicine

## 2021-08-17 NOTE — Telephone Encounter (Signed)
Requested Prescriptions  ?Pending Prescriptions Disp Refills  ?? atorvastatin (LIPITOR) 40 MG tablet [Pharmacy Med Name: ATORVASTATIN 40 MG TABLET] 90 tablet 1  ?  Sig: Take 1 tablet (40 mg total) by mouth daily.  ?  ? Cardiovascular:  Antilipid - Statins Failed - 08/16/2021  1:45 PM  ?  ?  Failed - Lipid Panel in normal range within the last 12 months  ?  Cholesterol, Total  ?Date Value Ref Range Status  ?07/20/2021 107 100 - 199 mg/dL Final  ? ?LDL Chol Calc (NIH)  ?Date Value Ref Range Status  ?07/20/2021 55 0 - 99 mg/dL Final  ? ?HDL  ?Date Value Ref Range Status  ?07/20/2021 34 (L) >39 mg/dL Final  ? ?Triglycerides  ?Date Value Ref Range Status  ?07/20/2021 90 0 - 149 mg/dL Final  ? ?  ?  ?  Passed - Patient is not pregnant  ?  ?  Passed - Valid encounter within last 12 months  ?  Recent Outpatient Visits   ?      ? 2 weeks ago Chronic bronchitis, unspecified chronic bronchitis type ([place])  ? [facility] Vigg, Avanti, MD  ? 6 months ago Need for influenza vaccination  ? [facility] Vigg, Avanti, MD  ? 8 months ago Cough  ? [facility] Vigg, Avanti, MD  ? 9 months ago Suspected COVID-19 virus infection  ? [place], Megan P, DO  ? 1 year ago Essential hypertension  ? [place], [place] P, DO  ?  ?  ?Future Appointments   ?        ? In 5 months Vigg, Avanti, MD [facility]  ?  ? ?  ?  ?  ? ? ?

## 2021-08-26 DIAGNOSIS — Z20822 Contact with and (suspected) exposure to covid-19: Secondary | ICD-10-CM | POA: Diagnosis not present

## 2021-09-06 DIAGNOSIS — Z20822 Contact with and (suspected) exposure to covid-19: Secondary | ICD-10-CM | POA: Diagnosis not present

## 2021-09-07 DIAGNOSIS — D0472 Carcinoma in situ of skin of left lower limb, including hip: Secondary | ICD-10-CM | POA: Diagnosis not present

## 2021-09-10 DIAGNOSIS — Z20822 Contact with and (suspected) exposure to covid-19: Secondary | ICD-10-CM | POA: Diagnosis not present

## 2021-09-13 ENCOUNTER — Other Ambulatory Visit: Payer: Self-pay | Admitting: Internal Medicine

## 2021-09-14 ENCOUNTER — Other Ambulatory Visit: Payer: Self-pay | Admitting: Internal Medicine

## 2021-09-14 NOTE — Telephone Encounter (Signed)
Requested Prescriptions  ?Pending Prescriptions Disp Refills  ?? ADVAIR HFA 115-21 MCG/ACT inhaler [Pharmacy Med Name: ADVAIR HFA 115-21 MCG INHALER] 12 g 0  ?  Sig: Inhale 2 inhalations into the lungs every 12 (twelve) hours  ?  ? Pulmonology:  Combination Products Passed - 09/13/2021 10:53 AM  ?  ?  Passed - Valid encounter within last 12 months  ?  Recent Outpatient Visits   ?      ? 1 month ago Chronic bronchitis, unspecified chronic bronchitis type ([place])  ? [facility] Vigg, Avanti, MD  ? 7 months ago Need for influenza vaccination  ? [facility] Vigg, Avanti, MD  ? 9 months ago Cough  ? [facility] Vigg, Avanti, MD  ? 10 months ago Suspected COVID-19 virus infection  ? [place], Megan P, DO  ? 1 year ago Essential hypertension  ? [place], [place] P, DO  ?  ?  ?Future Appointments   ?        ? In 4 months Vigg, Avanti, MD [facility]  ?  ? ?  ?  ?  ? ? ?

## 2021-09-15 NOTE — Telephone Encounter (Signed)
Refused Advair HFA 115-21 mcg/ACT inhaler because it's a duplicate request.  Sent and received at pharmacy on 09/14/2021 ?

## 2021-10-04 ENCOUNTER — Other Ambulatory Visit: Payer: Self-pay | Admitting: Internal Medicine

## 2021-10-04 DIAGNOSIS — K219 Gastro-esophageal reflux disease without esophagitis: Secondary | ICD-10-CM

## 2021-10-06 NOTE — Telephone Encounter (Signed)
Requested Prescriptions  Pending Prescriptions Disp Refills  . omeprazole (PRILOSEC) 40 MG capsule [Pharmacy Med Name: OMEPRAZOLE DR 40 MG CAPSULE] 90 capsule 0    Sig: Take 1 capsule (40 mg total) by mouth daily.     Gastroenterology: Proton Pump Inhibitors Passed - 10/04/2021 10:49 AM      Passed - Valid encounter within last 12 months    Recent Outpatient Visits          2 months ago Chronic bronchitis, unspecified chronic bronchitis type ([place])   [facility] Vigg, Avanti, MD   8 months ago Need for influenza vaccination   [facility] Vigg, Avanti, MD   10 months ago Cough   [facility] Vigg, Avanti, MD   11 months ago Suspected COVID-19 virus infection   [employer], [place], DO   1 year ago Essential hypertension   [place], Megan P, DO      Future Appointments            In 4 months Vigg, Avanti, MD [facility], [facility]

## 2021-10-11 ENCOUNTER — Other Ambulatory Visit: Payer: Self-pay | Admitting: Internal Medicine

## 2021-10-12 NOTE — Telephone Encounter (Signed)
Requested Prescriptions  Pending Prescriptions Disp Refills  . [place] 115-21 MCG/ACT inhaler [Pharmacy Med Name: ADVAIR HFA 115-21 MCG INHALER] 12 g 0    Sig: Inhale 2 inhalations into the lungs every 12 (twelve) hours     Pulmonology:  Combination Products Passed - 10/11/2021  1:42 PM      Passed - Valid encounter within last 12 months    Recent Outpatient Visits          2 months ago Chronic bronchitis, unspecified chronic bronchitis type ([place])   [facility] Vigg, Avanti, MD   8 months ago Need for influenza vaccination   [facility] Vigg, Avanti, MD   10 months ago Cough   [facility] Vigg, Avanti, MD   11 months ago Suspected COVID-19 virus infection   [employer], [place], DO   1 year ago Essential hypertension   [place], Megan P, DO      Future Appointments            In 3 months Vigg, Avanti, MD [facility]

## 2021-10-31 DIAGNOSIS — R079 Chest pain, unspecified: Secondary | ICD-10-CM | POA: Diagnosis not present

## 2021-12-22 DIAGNOSIS — H903 Sensorineural hearing loss, bilateral: Secondary | ICD-10-CM | POA: Diagnosis not present

## 2021-12-24 ENCOUNTER — Other Ambulatory Visit: Payer: Self-pay

## 2021-12-24 DIAGNOSIS — I10 Essential (primary) hypertension: Secondary | ICD-10-CM

## 2021-12-24 MED ORDER — BENAZEPRIL HCL 20 MG PO TABS: 20.0000 mg | ORAL_TABLET | Freq: Every day | ORAL | 4 refills | Status: DC

## 2021-12-24 NOTE — Telephone Encounter (Signed)
Previous Dr. Neomia Dear patient. Last seen 08/03/21 and has appointment 02/03/22.

## 2021-12-24 NOTE — Telephone Encounter (Signed)
Pharmacy requesting refill for Benazoril '20mg'$ .

## 2022-01-18 DIAGNOSIS — H353131 Nonexudative age-related macular degeneration, bilateral, early dry stage: Secondary | ICD-10-CM | POA: Diagnosis not present

## 2022-01-24 DIAGNOSIS — G4733 Obstructive sleep apnea (adult) (pediatric): Secondary | ICD-10-CM | POA: Diagnosis not present

## 2022-01-24 DIAGNOSIS — Z9989 Dependence on other enabling machines and devices: Secondary | ICD-10-CM | POA: Diagnosis not present

## 2022-01-24 DIAGNOSIS — J301 Allergic rhinitis due to pollen: Secondary | ICD-10-CM | POA: Diagnosis not present

## 2022-01-24 DIAGNOSIS — J4520 Mild intermittent asthma, uncomplicated: Secondary | ICD-10-CM | POA: Diagnosis not present

## 2022-02-03 ENCOUNTER — Encounter: Payer: Medicare Other | Admitting: Internal Medicine

## 2022-02-03 ENCOUNTER — Encounter: Payer: Self-pay | Admitting: Physician Assistant

## 2022-02-03 ENCOUNTER — Ambulatory Visit (INDEPENDENT_AMBULATORY_CARE_PROVIDER_SITE_OTHER): Payer: Medicare Other | Admitting: Physician Assistant

## 2022-02-03 VITALS — BP 124/71 | HR 98 | Temp 98.0°F | Wt 190.7 lb

## 2022-02-03 DIAGNOSIS — E785 Hyperlipidemia, unspecified: Secondary | ICD-10-CM

## 2022-02-03 DIAGNOSIS — J42 Unspecified chronic bronchitis: Secondary | ICD-10-CM | POA: Diagnosis not present

## 2022-02-03 DIAGNOSIS — I10 Essential (primary) hypertension: Secondary | ICD-10-CM

## 2022-02-03 MED ORDER — FLUTICASONE-SALMETEROL 115-21 MCG/ACT IN AERO: 2.0000 | INHALATION_SPRAY | Freq: Two times a day (BID) | RESPIRATORY_TRACT | 2 refills | Status: DC

## 2022-02-03 MED ORDER — BENAZEPRIL HCL 20 MG PO TABS: 20.0000 mg | ORAL_TABLET | Freq: Every day | ORAL | 4 refills | Status: DC

## 2022-02-03 MED ORDER — MONTELUKAST SODIUM 10 MG PO TABS: 10.0000 mg | ORAL_TABLET | Freq: Every day | ORAL | 2 refills | Status: DC

## 2022-02-03 MED ORDER — ATORVASTATIN CALCIUM 40 MG PO TABS: 40.0000 mg | ORAL_TABLET | Freq: Every day | ORAL | 1 refills | Status: DC

## 2022-02-03 MED ORDER — ALBUTEROL SULFATE HFA 108 (90 BASE) MCG/ACT IN AERS: 2.0000 | INHALATION_SPRAY | Freq: Four times a day (QID) | RESPIRATORY_TRACT | 1 refills | Status: DC | PRN

## 2022-02-03 NOTE — Assessment & Plan Note (Signed)
Chronic, historic condition Appears well controlled on Benazepril 20 mg PO QD and he appears to be tolerating well Continue current medication Recommend he engage in diet and exercise changes conducive to CVD health  Follow up in 6 months for monitoring or sooner if concerns arise.

## 2022-02-03 NOTE — Assessment & Plan Note (Signed)
Chronic, historic condition  Reviewed most recent lipid panel - cholesterol appeared to be in goal Will repeat today for monitoring-results to dictate management as indicated Continue medication regimen of Atorvastatin 40 mg PO QD  Follow up in 6 months

## 2022-02-03 NOTE — Progress Notes (Signed)
Established Patient Office Visit.   Name: Carl Fernandez   MRN: [id number]    DOB: 01-Dec-1940   Date:02/03/2022 Today's Provider: Talitha Givens, MHS, PA-C Introduced myself to the patient as a PA-C and provided education on APPs in clinical practice.    Subjective  Chief Complaint  Chief Complaint  Patient presents with   Annual Exam    Patient is here for Annual Exam.    Hypertension   Hyperlipidemia    Hypertension Pertinent negatives include no blurred vision, chest pain, headaches, palpitations or shortness of breath.  Hyperlipidemia Pertinent negatives include no chest pain or shortness of breath.    Patient is here with his wife, Kieth Brightly who is assisting with HPI  COPD States his breathing is pretty good Using controller inhaler almost every day Using rescue inhaler very rarely  Hypertension: - Medications: Benazepril 20 mg PO QD  - Compliance: excellent - Checking BP at home: occasionally  - Denies any SOB, CP, vision changes, LE edema, medication SEs, or symptoms of hypotension   Diet: Overall normal diet  Exercise: Not engaged in regular exercise. Tries to walk every once in a while.  Sleep:"mostly good" except the CPAP machine irritates his nose. Has tried to use a different mask to see if that will help.  Mood:"good"  Depression: phq 9 is negative    02/03/2022    9:33 AM 08/03/2021    2:41 PM 06/22/2021   10:44 AM 03/29/2021   10:43 AM 11/03/2020    2:38 PM  Depression screen PHQ 2/9  Decreased Interest 0 0 0 0 0  Down, Depressed, Hopeless 0 0 0 0 0  PHQ - 2 Score 0 0 0 0 0  Altered sleeping 1 0     Tired, decreased energy 1 1     Change in appetite 0 0     Feeling bad or failure about yourself  0 1     Trouble concentrating 0 0     Moving slowly or fidgety/restless 0 1     Suicidal thoughts 0 0     PHQ-9 Score 2 3     Difficult doing work/chores Not difficult at all Not difficult at all       Hypertension:  BP Readings from Last 3  Encounters:  02/03/22 124/71  08/03/21 127/74  02/17/21 130/71    Obesity: Wt Readings from Last 3 Encounters:  02/03/22 190 lb 11.2 oz (86.5 kg)  08/03/21 193 lb 6.4 oz (87.7 kg)  02/03/21 192 lb 3.2 oz (87.2 kg)   BMI Readings from Last 3 Encounters:  02/03/22 28.37 kg/m  08/03/21 28.78 kg/m  02/03/21 28.60 kg/m     Lipids:  Lab Results  Component Value Date   CHOL 107 07/20/2021   CHOL 206 (H) 12/29/2020   CHOL 201 (H) 03/25/2020   Lab Results  Component Value Date   HDL 34 (L) 07/20/2021   HDL 41 12/29/2020   HDL 38 (L) 03/25/2020   Lab Results  Component Value Date   LDLCALC 55 07/20/2021   LDLCALC 142 (H) 12/29/2020   LDLCALC 141 (H) 03/25/2020   Lab Results  Component Value Date   TRIG 90 07/20/2021   TRIG 125 12/29/2020   TRIG 122 03/25/2020   Lab Results  Component Value Date   CHOLHDL 3.1 07/20/2021   CHOLHDL 5.0 12/29/2020   CHOLHDL 5.6 (H) 03/27/2018   No results found for: "LDLDIRECT" Glucose:  Glucose  Date Value  Ref Range Status  07/20/2021 94 70 - 99 mg/dL Final  12/29/2020 82 65 - 99 mg/dL Final  03/25/2020 87 65 - 99 mg/dL Final    Flowsheet Row Clinical Support from 06/22/2021 in [place]  AUDIT-C Score 0        Married STD testing and prevention (HIV/chl/gon/syphilis):  No no Hep C Screening:  Skin cancer: Discussed monitoring for atypical lesions Colorectal cancer: He has had colonoscopy - aged out at this time.  Prostate cancer:  yes- ordered today  No results found for: "PSA"   Lung cancer:  Low Dose CT Chest recommended if Age 73-80 years, 30 pack-year currently smoking OR have quit w/in 15years. Patient  no AAA: The USPSTF recommends one-time screening with ultrasonography in men ages 33 to 4 years who have ever smoked. Patient:  not applicable ECG:  NA  Vaccines:   HPV: aged out Tdap: Up to date- next due in 2025 Shingrix: completed Pneumonia: Completed  Flu: completed  COVID-19: has  completed 3 vaccines so far. Discussed newest booster and importance for preventing illness.   Advanced Care Planning: A voluntary discussion about advance care planning including the explanation and discussion of advance directives.  Discussed health care proxy and Living will, and the patient was able to identify a health care proxy as his wife, Kieth Brightly.  Patient does have a living will in effect   Patient Active Problem List   Diagnosis Date Noted   Hyperlipidemia 02/03/2021   Nocturia 02/03/2021   Essential hypertension 01/31/2017   Advanced care planning/counseling discussion 01/31/2017   Hypercholesteremia 05/23/2016   Allergic rhinitis 10/29/2014   COPD (chronic obstructive pulmonary disease) (HCC)    OSA on CPAP    Hard of hearing    GERD (gastroesophageal reflux disease)    Mild intermittent asthma without complication 89/38/1017    Past Surgical History:  Procedure Laterality Date   LITHOTRIPSY     nasoplasty      Family History  Problem Relation Age of Onset   Hypertension Mother    Osteoporosis Mother    Cancer Mother        skin on her nose   Heart disease Father     Social History   Socioeconomic History   Marital status: Married    Spouse name: Not on file   Number of children: Not on file   Years of education: Not on file   Highest education level: Not on file  Occupational History   Not on file  Tobacco Use   Smoking status: Never   Smokeless tobacco: Never  Vaping Use   Vaping Use: Never used  Substance and Sexual Activity   Alcohol use: No   Drug use: No   Sexual activity: Not Currently  Other Topics Concern   Not on file  Social History Narrative   Not on file   Social Determinants of Health   Financial Resource Strain: Low Risk  (06/22/2021)   Overall Financial Resource Strain (CARDIA)    Difficulty of Paying Living Expenses: Not hard at all  Food Insecurity: No Food Insecurity (06/22/2021)   Hunger Vital Sign    Worried About Running  Out of Food in the Last Year: Never true    [place] in the Last Year: Never true  Transportation Needs: No Transportation Needs (06/22/2021)   PRAPARE - Hydrologist (Medical): No    Lack of Transportation (Non-Medical): No  Physical Activity: Inactive (06/22/2021)  Exercise Vital Sign    Days of Exercise per Week: 0 days    Minutes of Exercise per Session: 0 min  Stress: No Stress Concern Present (06/22/2021)   [place]    Feeling of Stress : Not at all  Social Connections: Moderately Integrated (06/22/2021)   Social Connection and Isolation Panel [NHANES]    Frequency of Communication with Friends and Family: More than three times a week    Frequency of Social Gatherings with Friends and Family: Three times a week    Attends Religious Services: More than 4 times per year    Active Member of Clubs or Organizations: No    Attends Archivist Meetings: Never    Marital Status: Married  Human resources officer Violence: Not At Risk (06/22/2021)   Humiliation, Afraid, Rape, and Kick questionnaire    Fear of Current or Ex-Partner: No    Emotionally Abused: No    Physically Abused: No    Sexually Abused: No     Current Outpatient Medications:    fluticasone (FLONASE) 50 MCG/ACT nasal spray, Place into the nose., Disp: , Rfl:    omeprazole (PRILOSEC) 40 MG capsule, Take 1 capsule (40 mg total) by mouth daily., Disp: 90 capsule, Rfl: 1   albuterol (VENTOLIN HFA) 108 (90 Base) MCG/ACT inhaler, Inhale 2 puffs into the lungs every 6 (six) hours as needed for wheezing or shortness of breath., Disp: 18 g, Rfl: 1   atorvastatin (LIPITOR) 40 MG tablet, Take 1 tablet (40 mg total) by mouth daily., Disp: 90 tablet, Rfl: 1   benazepril (LOTENSIN) 20 MG tablet, Take 1 tablet (20 mg total) by mouth daily., Disp: 30 tablet, Rfl: 4   fluticasone-salmeterol (ADVAIR HFA) 115-21 MCG/ACT inhaler, Inhale 2  puffs into the lungs 2 (two) times daily., Disp: 12 g, Rfl: 2   montelukast (SINGULAIR) 10 MG tablet, Take 1 tablet (10 mg total) by mouth at bedtime., Disp: 90 tablet, Rfl: 2  Allergies  Allergen Reactions   Amoxicillin    Penicillin G     Other reaction(s): Unknown     Review of Systems  Constitutional:  Negative for chills, diaphoresis and fever.  HENT:         Sneezing   Eyes:  Negative for blurred vision and double vision.  Respiratory:  Negative for cough, shortness of breath and wheezing.   Cardiovascular:  Negative for chest pain, palpitations and leg swelling.  Gastrointestinal:  Negative for blood in stool, constipation, diarrhea, heartburn, nausea and vomiting.  Neurological:  Negative for dizziness, tingling, tremors and headaches.  Psychiatric/Behavioral:  Negative for depression and memory loss. The patient is not nervous/anxious.        Objective  Vitals:   02/03/22 0924  BP: 124/71  Pulse: 98  Temp: 98 F (36.7 C)  SpO2: 98%  Weight: 190 lb 11.2 oz (86.5 kg)    Body mass index is 28.37 kg/m.  Physical Exam Vitals reviewed.  Constitutional:      General: He is awake.     Appearance: Normal appearance. He is well-developed, well-groomed and normal weight.  HENT:     Head: Normocephalic and atraumatic.  Neck:     Thyroid: No thyroid mass, thyromegaly or thyroid tenderness.  Cardiovascular:     Rate and Rhythm: Normal rate and regular rhythm.     Pulses: Normal pulses.          Radial pulses are 2+ on the right side and  2+ on the left side.     Heart sounds: Normal heart sounds. No murmur heard.    No friction rub. No gallop.  Pulmonary:     Effort: Pulmonary effort is normal.     Breath sounds: Normal breath sounds. No decreased air movement. No decreased breath sounds, wheezing, rhonchi or rales.  Abdominal:     General: Abdomen is flat. Bowel sounds are normal.     Palpations: Abdomen is soft.  Musculoskeletal:     Cervical back: Normal  range of motion and neck supple.     Right lower leg: No edema.     Left lower leg: No edema.  Lymphadenopathy:     Head:     Right side of head: No submental, submandibular or preauricular adenopathy.     Left side of head: No submental, submandibular or preauricular adenopathy.     Upper Body:     Right upper body: No supraclavicular adenopathy.     Left upper body: No supraclavicular adenopathy.  Neurological:     General: No focal deficit present.     Mental Status: He is alert and oriented to person, place, and time.     GCS: GCS eye subscore is 4. GCS verbal subscore is 5. GCS motor subscore is 6.     Cranial Nerves: No dysarthria or facial asymmetry.     Motor: No weakness or tremor.     Gait: Gait is intact.  Psychiatric:        Attention and Perception: Attention and perception normal.        Mood and Affect: Mood and affect normal.        Speech: Speech normal.        Behavior: Behavior normal. Behavior is cooperative.        Thought Content: Thought content normal.      No results found for this or any previous visit (from the past 2160 hour(s)).   Fall Risk:    02/03/2022    9:34 AM 08/03/2021    2:39 PM 06/22/2021   10:37 AM 03/29/2021   10:34 AM 02/03/2021    1:47 PM  Fall Risk   Falls in the past year? 0 0 0 0 0  Number falls in past yr: 0 0 0 0 0  Injury with Fall? 0 0 0 0 0  Risk for fall due to : No Fall Risks No Fall Risks   No Fall Risks  Follow up Falls evaluation completed Falls evaluation completed Falls evaluation completed;Falls prevention discussed;Education provided Falls evaluation completed;Falls prevention discussed Falls evaluation completed     Functional Status Survey: Is the patient deaf or have difficulty hearing?: No Does the patient have difficulty seeing, even when wearing glasses/contacts?: No Does the patient have difficulty concentrating, remembering, or making decisions?: No Does the patient have difficulty walking or climbing  stairs?: No Does the patient have difficulty dressing or bathing?: No Does the patient have difficulty doing errands alone such as visiting a doctor's office or shopping?: No    Assessment & Plan  Problem List Items Addressed This Visit       Cardiovascular and Mediastinum   Essential hypertension    Chronic, historic condition Appears well controlled on Benazepril 20 mg PO QD and he appears to be tolerating well Continue current medication Recommend he engage in diet and exercise changes conducive to CVD health  Follow up in 6 months for monitoring or sooner if concerns arise.  Relevant Medications   benazepril (LOTENSIN) 20 MG tablet   atorvastatin (LIPITOR) 40 MG tablet   Other Relevant Orders   Comp Met (CMET)   CBC w/Diff     Respiratory   COPD (chronic obstructive pulmonary disease) (HCC)    Chronic, historic condition Is currently using Advair 115-21, Montelukast, and Albuterol inhaler for managing Will send in refills for these to continue therapy.  Continue current regimen as he reports breathing is well controlled  Follow up in 6 months for monitoring or sooner if concerns arise.        Relevant Medications   fluticasone (FLONASE) 50 MCG/ACT nasal spray   fluticasone-salmeterol (ADVAIR HFA) 115-21 MCG/ACT inhaler   montelukast (SINGULAIR) 10 MG tablet   albuterol (VENTOLIN HFA) 108 (90 Base) MCG/ACT inhaler     Other   Hyperlipidemia - Primary    Chronic, historic condition  Reviewed most recent lipid panel - cholesterol appeared to be in goal Will repeat today for monitoring-results to dictate management as indicated Continue medication regimen of Atorvastatin 40 mg PO QD  Follow up in 6 months      Relevant Medications   benazepril (LOTENSIN) 20 MG tablet   atorvastatin (LIPITOR) 40 MG tablet   Other Relevant Orders   Lipid panel     Return in about 6 months (around 08/04/2022) for HTN, HLD, COPD.   I, Rianna Lukes E Aayden Cefalu, PA-C, have reviewed  all documentation for this visit. The documentation on 02/03/22 for the exam, diagnosis, procedures, and orders are all accurate and complete.   Talitha Givens, MHS, PA-C [facility]

## 2022-02-03 NOTE — Assessment & Plan Note (Signed)
Chronic, historic condition Is currently using Advair 115-21, Montelukast, and Albuterol inhaler for managing Will send in refills for these to continue therapy.  Continue current regimen as he reports breathing is well controlled  Follow up in 6 months for monitoring or sooner if concerns arise.

## 2022-02-04 LAB — COMPREHENSIVE METABOLIC PANEL
ALT: 10 IU/L (ref 0–44)
AST: 14 IU/L (ref 0–40)
Albumin/Globulin Ratio: 1.9 (ref 1.2–2.2)
Albumin: 4.4 g/dL (ref 3.7–4.7)
Alkaline Phosphatase: 91 IU/L (ref 44–121)
BUN/Creatinine Ratio: 10 (ref 10–24)
BUN: 13 mg/dL (ref 8–27)
Bilirubin Total: 0.7 mg/dL (ref 0.0–1.2)
CO2: 23 mmol/L (ref 20–29)
Calcium: 8.9 mg/dL (ref 8.6–10.2)
Chloride: 106 mmol/L (ref 96–106)
Creatinine, Ser: 1.29 mg/dL — ABNORMAL HIGH (ref 0.76–1.27)
Globulin, Total: 2.3 g/dL (ref 1.5–4.5)
Glucose: 94 mg/dL (ref 70–99)
Potassium: 4.4 mmol/L (ref 3.5–5.2)
Sodium: 143 mmol/L (ref 134–144)
Total Protein: 6.7 g/dL (ref 6.0–8.5)
eGFR: 56 mL/min/{1.73_m2} — ABNORMAL LOW (ref >59)

## 2022-02-04 LAB — CBC WITH DIFFERENTIAL/PLATELET
Basophils Absolute: 0 10*3/uL (ref 0.0–0.2)
Basos: 1 %
EOS (ABSOLUTE): 0.2 10*3/uL (ref 0.0–0.4)
Eos: 3 %
Hematocrit: 40.7 % (ref 37.5–51.0)
Hemoglobin: 13.3 g/dL (ref 13.0–17.7)
Immature Grans (Abs): 0 10*3/uL (ref 0.0–0.1)
Immature Granulocytes: 0 %
Lymphocytes Absolute: 1.2 10*3/uL (ref 0.7–3.1)
Lymphs: 20 %
MCH: 29.4 pg (ref 26.6–33.0)
MCHC: 32.7 g/dL (ref 31.5–35.7)
MCV: 90 fL (ref 79–97)
Monocytes Absolute: 0.5 10*3/uL (ref 0.1–0.9)
Monocytes: 8 %
Neutrophils Absolute: 4.1 10*3/uL (ref 1.4–7.0)
Neutrophils: 68 %
Platelets: 175 10*3/uL (ref 150–450)
RBC: 4.53 x10E6/uL (ref 4.14–5.80)
RDW: 12.6 % (ref 11.6–15.4)
WBC: 6 10*3/uL (ref 3.4–10.8)

## 2022-02-05 LAB — LIPID PANEL
Chol/HDL Ratio: 3.2 ratio (ref 0.0–5.0)
Cholesterol, Total: 99 mg/dL — ABNORMAL LOW (ref 100–199)
HDL: 31 mg/dL — ABNORMAL LOW (ref >39)
LDL Chol Calc (NIH): 52 mg/dL (ref 0–99)
Triglycerides: 77 mg/dL (ref 0–149)
VLDL Cholesterol Cal: 16 mg/dL (ref 5–40)

## 2022-02-05 LAB — SPECIMEN STATUS REPORT

## 2022-02-07 NOTE — Progress Notes (Signed)
Cholesterol is well controlled at this time. Continue medications.

## 2022-03-17 DIAGNOSIS — D2261 Melanocytic nevi of right upper limb, including shoulder: Secondary | ICD-10-CM | POA: Diagnosis not present

## 2022-03-17 DIAGNOSIS — D2271 Melanocytic nevi of right lower limb, including hip: Secondary | ICD-10-CM | POA: Diagnosis not present

## 2022-03-17 DIAGNOSIS — Z85828 Personal history of other malignant neoplasm of skin: Secondary | ICD-10-CM | POA: Diagnosis not present

## 2022-03-17 DIAGNOSIS — D2262 Melanocytic nevi of left upper limb, including shoulder: Secondary | ICD-10-CM | POA: Diagnosis not present

## 2022-03-17 DIAGNOSIS — X32XXXA Exposure to sunlight, initial encounter: Secondary | ICD-10-CM | POA: Diagnosis not present

## 2022-03-17 DIAGNOSIS — L570 Actinic keratosis: Secondary | ICD-10-CM | POA: Diagnosis not present

## 2022-03-22 ENCOUNTER — Other Ambulatory Visit: Payer: Self-pay | Admitting: Physician Assistant

## 2022-03-22 DIAGNOSIS — J42 Unspecified chronic bronchitis: Secondary | ICD-10-CM

## 2022-03-22 NOTE — Telephone Encounter (Signed)
Requested medication (s) are due for refill today: yes  Requested medication (s) are on the active medication list: yes  Last refill:  02/03/22 #18g/1  Future visit scheduled: yes  Notes to clinic:  Dr. Neomia Dear pt.      Requested Prescriptions  Pending Prescriptions Disp Refills   albuterol (VENTOLIN HFA) 108 (90 Base) MCG/ACT inhaler [Pharmacy Med Name: ALBUTEROL HFA 90 MCG INHALER] 18 g 0    Sig: Inhale 2 puffs into the lungs every 6 (six) hours as needed for wheezing or shortness of breath.     Pulmonology:  Beta Agonists 2 Passed - 03/22/2022 10:33 AM      Passed - Last BP in normal range    BP Readings from Last 1 Encounters:  02/03/22 124/71         Passed - Last Heart Rate in normal range    Pulse Readings from Last 1 Encounters:  02/03/22 98         Passed - Valid encounter within last 12 months    Recent Outpatient Visits           1 month ago Hyperlipidemia, unspecified hyperlipidemia type   [facility] Mecum, Erin E, PA-C   7 months ago Chronic bronchitis, unspecified chronic bronchitis type ([place])   [facility] Vigg, Avanti, MD   1 year ago Need for influenza vaccination   [facility] Vigg, Avanti, MD   1 year ago Cough   [facility] Vigg, Avanti, MD   1 year ago Suspected COVID-19 virus infection   [facility] [place], [place], DO       Future Appointments             In 4 months Mecum, Dani Gobble, PA-C [employer], [facility]

## 2022-04-13 ENCOUNTER — Other Ambulatory Visit: Payer: Self-pay

## 2022-04-13 DIAGNOSIS — K219 Gastro-esophageal reflux disease without esophagitis: Secondary | ICD-10-CM

## 2022-04-13 NOTE — Telephone Encounter (Signed)
Medication refill for omeprazole '40mg'$  last ov 02/03/22, upcoming ov 08/04/22 . Please advise

## 2022-04-14 ENCOUNTER — Other Ambulatory Visit: Payer: Self-pay | Admitting: Physician Assistant

## 2022-04-14 DIAGNOSIS — J42 Unspecified chronic bronchitis: Secondary | ICD-10-CM

## 2022-04-14 NOTE — Telephone Encounter (Signed)
Requested medication (s) are due for refill today - no  Requested medication (s) are on the active medication list -yes  Future visit scheduled -yes  Last refill: 03/21/22  Notes to clinic: Is this too soon for rescue inhaler?  Requested Prescriptions  Pending Prescriptions Disp Refills   albuterol (VENTOLIN HFA) 108 (90 Base) MCG/ACT inhaler [Pharmacy Med Name: ALBUTEROL HFA 90 MCG INHALER] 18 g 0    Sig: Inhale 2 puffs into the lungs every 6 (six) hours as needed for wheezing or shortness of breath.     Pulmonology:  Beta Agonists 2 Passed - 04/14/2022 12:47 PM      Passed - Last BP in normal range    BP Readings from Last 1 Encounters:  02/03/22 124/71         Passed - Last Heart Rate in normal range    Pulse Readings from Last 1 Encounters:  02/03/22 98         Passed - Valid encounter within last 12 months    Recent Outpatient Visits           2 months ago Hyperlipidemia, unspecified hyperlipidemia type   [facility] Mecum, Erin E, PA-C   8 months ago Chronic bronchitis, unspecified chronic bronchitis type ([place])   [facility] Vigg, Avanti, MD   1 year ago Need for influenza vaccination   [place] Vigg, Avanti, MD   1 year ago Cough   [facility] Vigg, Avanti, MD   1 year ago Suspected COVID-19 virus infection   [facility] [place], [place], DO       Future Appointments             In 3 months Mecum, Erin E, PA-C [facility], [facility]               Requested Prescriptions  Pending Prescriptions Disp Refills   albuterol (VENTOLIN HFA) 108 (90 Base) MCG/ACT inhaler [Pharmacy Med Name: ALBUTEROL HFA 90 MCG INHALER] 18 g 0    Sig: Inhale 2 puffs into the lungs every 6 (six) hours as needed for wheezing or shortness of breath.     Pulmonology:  Beta Agonists 2 Passed - 04/14/2022 12:47 PM      Passed - Last BP in normal range    BP Readings from Last 1 Encounters:  02/03/22  124/71         Passed - Last Heart Rate in normal range    Pulse Readings from Last 1 Encounters:  02/03/22 98         Passed - Valid encounter within last 12 months    Recent Outpatient Visits           2 months ago Hyperlipidemia, unspecified hyperlipidemia type   [facility] Mecum, Erin E, PA-C   8 months ago Chronic bronchitis, unspecified chronic bronchitis type ([place])   [facility] Vigg, Avanti, MD   1 year ago Need for influenza vaccination   [facility] Vigg, Avanti, MD   1 year ago Cough   [facility] Vigg, Avanti, MD   1 year ago Suspected COVID-19 virus infection   [facility] [place], [place], DO       Future Appointments             In 3 months Mecum, Dani Gobble, PA-C [employer], [facility]

## 2022-04-15 MED ORDER — OMEPRAZOLE 40 MG PO CPDR: 40.0000 mg | DELAYED_RELEASE_CAPSULE | Freq: Every day | ORAL | 1 refills | Status: DC

## 2022-05-16 ENCOUNTER — Other Ambulatory Visit: Payer: Self-pay | Admitting: Physician Assistant

## 2022-05-16 DIAGNOSIS — J42 Unspecified chronic bronchitis: Secondary | ICD-10-CM

## 2022-05-26 DIAGNOSIS — J4520 Mild intermittent asthma, uncomplicated: Secondary | ICD-10-CM | POA: Diagnosis not present

## 2022-05-26 DIAGNOSIS — G4733 Obstructive sleep apnea (adult) (pediatric): Secondary | ICD-10-CM | POA: Diagnosis not present

## 2022-05-26 DIAGNOSIS — J301 Allergic rhinitis due to pollen: Secondary | ICD-10-CM | POA: Diagnosis not present

## 2022-06-08 ENCOUNTER — Telehealth: Payer: Self-pay | Admitting: Family Medicine

## 2022-06-08 NOTE — Telephone Encounter (Signed)
Copied from [place] [id number]. Topic: Medicare AWV >> Jun 08, 2022 10:56 AM Devoria Glassing wrote: Reason for CRM: Left message for patient to schedule Annual Wellness Visit.  Please schedule with Health Nurse Advisor Kirke Shaggy at [facility].Call [place] at [phone]

## 2022-06-13 ENCOUNTER — Other Ambulatory Visit: Payer: Self-pay | Admitting: Physician Assistant

## 2022-06-13 DIAGNOSIS — J42 Unspecified chronic bronchitis: Secondary | ICD-10-CM

## 2022-06-14 NOTE — Telephone Encounter (Signed)
Requested Prescriptions  Pending Prescriptions Disp Refills   fluticasone-salmeterol (ADVAIR HFA) 115-21 MCG/ACT inhaler [Pharmacy Med Name: FLUTICASONE-SALMETEROL 115-21] 12 g 2    Sig: Inhale 2 puffs into the lungs 2 (two) times daily.     Pulmonology:  Combination Products Passed - 06/13/2022  7:13 AM      Passed - Valid encounter within last 12 months    Recent Outpatient Visits           4 months ago Hyperlipidemia, unspecified hyperlipidemia type   [place] [facility] Mecum, Erin E, PA-C   10 months ago Chronic bronchitis, unspecified chronic bronchitis type ([place])   [place] [facility] Vigg, Avanti, MD   1 year ago Need for influenza vaccination   [place] Charlynne Cousins, MD   1 year ago Cough   [place] [facility] Vigg, Avanti, MD   1 year ago Suspected COVID-19 virus infection   [place] [facility] [place], [place], DO       Future Appointments             In 1 month Mecum, Dani Gobble, PA-C [place], [place]

## 2022-06-15 ENCOUNTER — Other Ambulatory Visit: Payer: Self-pay | Admitting: Physician Assistant

## 2022-06-15 DIAGNOSIS — J42 Unspecified chronic bronchitis: Secondary | ICD-10-CM

## 2022-06-16 ENCOUNTER — Encounter: Payer: Self-pay | Admitting: Physician Assistant

## 2022-06-16 ENCOUNTER — Ambulatory Visit (INDEPENDENT_AMBULATORY_CARE_PROVIDER_SITE_OTHER): Payer: Medicare Other | Admitting: Physician Assistant

## 2022-06-16 VITALS — BP 130/70 | HR 79 | Temp 97.9°F | Resp 16 | Ht 68.74 in | Wt 189.4 lb

## 2022-06-16 DIAGNOSIS — J42 Unspecified chronic bronchitis: Secondary | ICD-10-CM | POA: Diagnosis not present

## 2022-06-16 DIAGNOSIS — J069 Acute upper respiratory infection, unspecified: Secondary | ICD-10-CM | POA: Diagnosis not present

## 2022-06-16 MED ORDER — MONTELUKAST SODIUM 10 MG PO TABS: 10.0000 mg | ORAL_TABLET | Freq: Every day | ORAL | 2 refills | Status: DC

## 2022-06-16 NOTE — Progress Notes (Signed)
Acute Office Visit   Patient: Carl Fernandez   DOB: 03/17/1941   82 y.o. Male  MRN: [id number] Visit Date: 06/16/2022  Today's healthcare provider: Dani Gobble Jaelyn Cloninger, PA-C  Introduced myself to the patient as a Journalist, newspaper and provided education on APPs in clinical practice.    Chief Complaint  Patient presents with   Nasal Congestion   Headache   Cough    Sx since last Thursday   Subjective    HPI HPI     Cough    Additional comments: Sx since last Thursday      Last edited by Salomon Fick, CMA on 06/16/2022  9:39 AM.       URI-type symptoms  Onset: sudden  Duration: ongoing since last Thursday   Associated symptoms:  Seemed to start with scratchy throat, then developed watery eyes, runny nose, headaches, sneezing, dry cough, hoarse voice, mild chills    He was able to climb and descend stairs in building without issue  He reports he feels like he is getting better overall since first getting sick    Interventions:He has been taking his Flonase, Montelukast, and Claritin, Tylenol   Recent sick contacts: denies recent sick contacts to his knowledge   COVID testing at home: has not tested   Results: NA   COPD status: appears stable right now  Increased rescue inhaler use: no increased use since illness started    Medications: Outpatient Medications Prior to Visit  Medication Sig   albuterol (VENTOLIN HFA) 108 (90 Base) MCG/ACT inhaler Inhale 2 puffs into the lungs every 6 (six) hours as needed for wheezing or shortness of breath.   atorvastatin (LIPITOR) 40 MG tablet Take 1 tablet (40 mg total) by mouth daily.   benazepril (LOTENSIN) 20 MG tablet Take 1 tablet (20 mg total) by mouth daily.   fluticasone (FLONASE) 50 MCG/ACT nasal spray Place into the nose.   fluticasone-salmeterol (ADVAIR HFA) 115-21 MCG/ACT inhaler Inhale 2 puffs into the lungs 2 (two) times daily.   loratadine (CLARITIN) 10 MG tablet Take 10 mg by mouth daily.   montelukast  (SINGULAIR) 10 MG tablet Take 1 tablet (10 mg total) by mouth at bedtime.   omeprazole (PRILOSEC) 40 MG capsule Take 1 capsule (40 mg total) by mouth daily.   No facility-administered medications prior to visit.    Review of Systems  Constitutional:  Negative for chills and fever.  HENT:  Positive for rhinorrhea, sneezing and sore throat. Negative for ear pain.   Eyes:  Positive for discharge.  Respiratory:  Positive for cough. Negative for shortness of breath and wheezing.   Gastrointestinal:  Negative for diarrhea, nausea and vomiting.  Musculoskeletal:  Negative for myalgias.  Neurological:  Positive for headaches.       Objective    BP 130/70   Pulse 79   Temp 97.9 F (36.6 C) (Oral)   Resp 16   Ht 5' 8.74" (1.746 m)   Wt 189 lb 6.4 oz (85.9 kg)   SpO2 96%   BMI 28.18 kg/m    Physical Exam Vitals reviewed.  Constitutional:      Appearance: Normal appearance. He is well-developed.  HENT:     Head: Normocephalic and atraumatic.     Right Ear: Tympanic membrane, ear canal and external ear normal.     Left Ear: Tympanic membrane, ear canal and external ear normal.     Mouth/Throat:     Mouth: Mucous membranes are  moist.     Pharynx: No oropharyngeal exudate or posterior oropharyngeal erythema.  Eyes:     Extraocular Movements: Extraocular movements intact.     Conjunctiva/sclera: Conjunctivae normal.     Pupils: Pupils are equal, round, and reactive to light.  Cardiovascular:     Rate and Rhythm: Normal rate and regular rhythm.     Pulses: Normal pulses.     Heart sounds: Normal heart sounds. No murmur heard.    No friction rub. No gallop.  Pulmonary:     Effort: Pulmonary effort is normal.     Breath sounds: Normal breath sounds. No wheezing, rhonchi or rales.  Musculoskeletal:     Cervical back: Normal range of motion.  Lymphadenopathy:     Head:     Right side of head: No submental, submandibular or preauricular adenopathy.     Left side of head: No  submental, submandibular or preauricular adenopathy.     Cervical:     Right cervical: No superficial or posterior cervical adenopathy.    Left cervical: No superficial or posterior cervical adenopathy.     Upper Body:     Right upper body: No supraclavicular adenopathy.     Left upper body: No supraclavicular adenopathy.  Neurological:     Mental Status: He is alert and oriented to person, place, and time. Mental status is at baseline.     GCS: GCS eye subscore is 4. GCS verbal subscore is 5. GCS motor subscore is 6.  Psychiatric:        Mood and Affect: Mood normal.        Speech: Speech normal.        Behavior: Behavior normal.       No results found for any visits on 06/16/22.  Assessment & Plan      No follow-ups on file.     Problem List Items Addressed This Visit       Respiratory   COPD (chronic obstructive pulmonary disease) ([place])  Refill provided per patient request   Relevant Medications   loratadine (CLARITIN) 10 MG tablet   montelukast (SINGULAIR) 10 MG tablet   Other Visit Diagnoses     Upper respiratory tract infection, unspecified type    -  Primary Acute, new concern Reports dry cough, congestion, rhinorrhea, sneezing and scratchy throat for about a week that seems to be improving He has not had to use his rescue inhaler since becoming ill and denies breathing concerns at this time Recommend monitoring and symptomatic management with OTC medications -reviewed using Mucinex and Robitussin in addition to his prescription antihistamines- printout of these instructions provided in AVS Reviewed using inhalers as needed Reviewed return and ED precautions  Follow up as needed for persistent, progressing or recurrent symptoms     Relevant Orders   COVID-19, Flu A+B and RSV        No follow-ups on file.   I, Krystalynn Ridgeway E Aneya Daddona, PA-C, have reviewed all documentation for this visit. The documentation on 06/16/22 for the exam, diagnosis, procedures, and orders  are all accurate and complete.   Talitha Givens, MHS, PA-C [facility]

## 2022-06-16 NOTE — Telephone Encounter (Signed)
Refused Advair inhaler because this is a duplicate request.  Rx received by [employer] 06/14/2022.

## 2022-06-16 NOTE — Patient Instructions (Addendum)
Based on your described symptoms and the duration of symptoms it is likely that you have a viral upper respiratory infection (often called a "cold")  Symptoms can last for 3-10 days with lingering cough and intermittent symptoms lasting weeks after that.  The goal of treatment at this time is to reduce your symptoms and discomfort   You can use over the counter medications such as Dayquil/Nyquil, AlkaSeltzer formulations, etc to provide further relief of symptoms according to the manufacturer's instructions   If you have high blood pressure, I recommend taking regular Robitussin and Mucinex instead of the combination formulas  to help with the coughing and congestion . Avoid things with decongestants or labeled DM.  If your symptoms do not improve or become worse in the next 5-7 days please make an apt at the office so we can see you  Go to the ER if you begin to have more serious symptoms such as shortness of breath, trouble breathing, loss of consciousness, swelling around the eyes, high fever, severe lasting headaches, vision changes or neck pain/stiffness.   It was nice to see  you and I appreciate the opportunity to be involved in your care If you were satisfied with the care you received from me, I would greatly appreciate you saying so in the after-visit survey that is sent out following our visit.

## 2022-06-17 LAB — COVID-19, FLU A+B AND RSV
Influenza A, NAA: NOT DETECTED
Influenza B, NAA: NOT DETECTED
RSV, NAA: NOT DETECTED
SARS-CoV-2, NAA: NOT DETECTED

## 2022-06-17 NOTE — Progress Notes (Signed)
Negative for COVID, flu and RSV  Please continue with symptomatic management as discussed at your apt

## 2022-07-04 ENCOUNTER — Ambulatory Visit (INDEPENDENT_AMBULATORY_CARE_PROVIDER_SITE_OTHER): Payer: Medicare Other

## 2022-07-04 VITALS — Ht 68.75 in | Wt 189.0 lb

## 2022-07-04 DIAGNOSIS — Z0000 Encounter for general adult medical examination without abnormal findings: Secondary | ICD-10-CM | POA: Diagnosis not present

## 2022-07-04 NOTE — Progress Notes (Signed)
I connected with  Carl Fernandez on 07/04/22 by a audio enabled telemedicine application and verified that I am speaking with the correct person using two identifiers.  Patient Location: Home  Provider Location: Office/Clinic  I discussed the limitations of evaluation and management by telemedicine. The patient expressed understanding and agreed to proceed.  Subjective:   Carl Fernandez is a 82 y.o. male who presents for Medicare Annual/Subsequent preventive examination.  Review of Systems    Cardiac Risk Factors include: advanced age (>60mn, >>42women);hypertension;male gender     Objective:    There were no vitals filed for this visit. There is no height or weight on file to calculate BMI.     07/04/2022    9:51 AM 06/22/2021   10:36 AM 03/29/2021   10:45 AM 03/27/2020   10:32 AM 03/25/2019   10:28 AM 03/22/2018   10:18 AM 01/26/2017    9:42 AM  Advanced Directives  Does Patient Have a Medical Advance Directive? No Yes No Yes Yes Yes Yes  Type of Advance Directive  Living will;Healthcare Power of [place] Living will;Healthcare Power of [place]  Does patient want to make changes to medical advance directive?      No - Patient declined   Copy of [place] Chart?  No - copy requested  No - copy requested No - copy requested No - copy requested No - copy requested  Would patient like information on creating a medical advance directive? No - Patient declined  No - Patient declined        Current Medications (verified) Outpatient Encounter Medications as of 07/04/2022  Medication Sig   albuterol (VENTOLIN HFA) 108 (90 Base) MCG/ACT inhaler Inhale 2 puffs into the lungs every 6 (six) hours as needed for wheezing or shortness of breath.   atorvastatin (LIPITOR) 40 MG tablet Take 1 tablet (40 mg total) by mouth daily.   benazepril  (LOTENSIN) 20 MG tablet Take 1 tablet (20 mg total) by mouth daily.   fluticasone (FLONASE) 50 MCG/ACT nasal spray Place into the nose.   fluticasone-salmeterol (ADVAIR HFA) 115-21 MCG/ACT inhaler Inhale 2 puffs into the lungs 2 (two) times daily.   loratadine (CLARITIN) 10 MG tablet Take 10 mg by mouth daily.   montelukast (SINGULAIR) 10 MG tablet Take 1 tablet (10 mg total) by mouth at bedtime.   omeprazole (PRILOSEC) 40 MG capsule Take 1 capsule (40 mg total) by mouth daily.   No facility-administered encounter medications on file as of 07/04/2022.    Allergies (verified) Amoxicillin and Penicillin g   History: Past Medical History:  Diagnosis Date   COPD (chronic obstructive pulmonary disease) (HCC)    Diverticulitis    GERD (gastroesophageal reflux disease)    Hard of hearing    Kidney stones    Need for influenza vaccination 02/08/2021   OSA on CPAP    Plantar fasciitis    Past Surgical History:  Procedure Laterality Date   LITHOTRIPSY     nasoplasty     Family History  Problem Relation Age of Onset   Hypertension Mother    Osteoporosis Mother    Cancer Mother        skin on her nose   Heart disease Father    Social History   Socioeconomic History   Marital status: Married    Spouse name: Not on file   Number of children: Not  on file   Years of education: Not on file   Highest education level: Not on file  Occupational History   Not on file  Tobacco Use   Smoking status: Never   Smokeless tobacco: Never  Vaping Use   Vaping Use: Never used  Substance and Sexual Activity   Alcohol use: No   Drug use: No   Sexual activity: Not Currently  Other Topics Concern   Not on file  Social History Narrative   Not on file   Social Determinants of Health   Financial Resource Strain: Low Risk  (07/04/2022)   Overall Financial Resource Strain (CARDIA)    Difficulty of Paying Living Expenses: Not hard at all  Food Insecurity: No Food Insecurity (07/04/2022)    Hunger Vital Sign    Worried About Running Out of Food in the Last Year: Never true    Ran Out of Food in the Last Year: Never true  Transportation Needs: No Transportation Needs (07/04/2022)   PRAPARE - Hydrologist (Medical): No    Lack of Transportation (Non-Medical): No  Physical Activity: Inactive (07/04/2022)   Exercise Vital Sign    Days of Exercise per Week: 0 days    Minutes of Exercise per Session: 0 min  Stress: No Stress Concern Present (07/04/2022)   [place]    Feeling of Stress : Not at all  Social Connections: Moderately Integrated (07/04/2022)   Social Connection and Isolation Panel [NHANES]    Frequency of Communication with Friends and Family: More than three times a week    Frequency of Social Gatherings with Friends and Family: Twice a week    Attends Religious Services: More than 4 times per year    Active Member of [employer] or Organizations: No    Attends Music therapist: Never    Marital Status: Married    Tobacco Counseling Counseling given: Not Answered   Clinical Intake:  Pre-visit preparation completed: Yes  Pain : No/denies pain     Nutritional Risks: None Diabetes: No  How often do you need to have someone help you when you read instructions, pamphlets, or other written materials from your doctor or pharmacy?: 1 - Never  Diabetic?o  Interpreter Needed?: No  Information entered by :: Kirke Shaggy, LPN   Activities of Daily Living    07/04/2022    9:52 AM 06/16/2022    9:38 AM  In your present state of health, do you have any difficulty performing the following activities:  Hearing? 1 0  Vision? 0 0  Difficulty concentrating or making decisions? 0 0  Walking or climbing stairs? 0 0  Dressing or bathing? 0 0  Doing errands, shopping? 0 0  Preparing Food and eating ? N   Using the Toilet? N   In the past six months, have you  accidently leaked urine? N   Do you have problems with loss of bowel control? N   Managing your Medications? N   Managing your Finances? N   Housekeeping or managing your Housekeeping? N     Patient Care Team: Valerie Roys, DO as PCP - General (Family Medicine) Erby Pian, MD as Referring Physician (Specialist) Dasher, Rayvon Char, MD (Dermatology) Manya Silvas, MD (Inactive) (Gastroenterology)  Indicate any recent Medical Services you may have received from other than [facility] providers in the past year (date may be approximate).     Assessment:  This is a routine wellness examination for Carl Fernandez.  Hearing/Vision screen Hearing Screening - Comments:: Wears aids Vision Screening - Comments:: Wears glasses- [facility]  Dietary issues and exercise activities discussed: Current Exercise Habits: The patient does not participate in regular exercise at present   Goals Addressed             This Visit's Progress    DIET - EAT MORE FRUITS AND VEGETABLES         Depression Screen    07/04/2022    9:50 AM 06/16/2022    9:38 AM 02/03/2022    9:33 AM 08/03/2021    2:41 PM 06/22/2021   10:44 AM 03/29/2021   10:43 AM 11/03/2020    2:38 PM  PHQ 2/9 Scores  PHQ - 2 Score 0 0 0 0 0 0 0  PHQ- 9 Score 0 0 2 3       Fall Risk    07/04/2022    9:52 AM 06/16/2022    9:38 AM 02/03/2022    9:34 AM 08/03/2021    2:39 PM 06/22/2021   10:37 AM  Fall Risk   Falls in the past year? 0 0 0 0 0  Number falls in past yr: 0 0 0 0 0  Injury with Fall? 0 0 0 0 0  Risk for fall due to : No Fall Risks No Fall Risks No Fall Risks No Fall Risks   Follow up Falls prevention discussed;Falls evaluation completed Falls prevention discussed;Education provided;Falls evaluation completed Falls evaluation completed Falls evaluation completed Falls evaluation completed;Falls prevention discussed;Education provided    FALL RISK PREVENTION PERTAINING TO THE HOME:  Any stairs in or around the home? Yes   If so, are there any without handrails? No  Home free of loose throw rugs in walkways, pet beds, electrical cords, etc? Yes  Adequate lighting in your home to reduce risk of falls? Yes   ASSISTIVE DEVICES UTILIZED TO PREVENT FALLS:  Life alert? No  Use of a cane, walker or w/c? No  Grab bars in the bathroom? Yes  Shower chair or bench in shower? No  Elevated toilet seat or a handicapped toilet? Yes   Cognitive Function:        07/04/2022    9:55 AM 03/29/2021   10:38 AM 03/27/2020   10:35 AM 03/25/2019   10:30 AM 03/22/2018   10:19 AM  6CIT Screen  What Year? 0 points 0 points 0 points 0 points 0 points  What month? 0 points 0 points 0 points 0 points 0 points  What time? 0 points 0 points 0 points 0 points 0 points  Count back from 20 0 points 0 points 0 points 0 points 0 points  Months in reverse 0 points 0 points 0 points 0 points 0 points  Repeat phrase 0 points 0 points 0 points 0 points 0 points  Total Score 0 points 0 points 0 points 0 points 0 points    Immunizations Immunization History  Administered Date(s) Administered   Fluad Quad(high Dose 65+) 02/03/2021, 01/27/2022   Influenza, High Dose Seasonal PF 01/26/2016, 01/26/2017, 03/22/2018   Influenza,inj,Quad PF,6+ Mos 04/29/2015   Influenza-Unspecified 01/07/2014, 01/10/2019, 02/12/2020, 01/27/2022   PFIZER(Purple Top)SARS-COV-2 Vaccination 05/29/2019, 06/22/2019, 02/12/2020   Pneumococcal Conjugate-13 04/29/2015   Pneumococcal Polysaccharide-23 01/26/2017   Rsv, Bivalent, Protein Subunit Rsvpref,pf Evans Lance) 02/15/2022   Tdap 10/09/2013   Unspecified SARS-COV-2 Vaccination 03/09/2022   Zoster Recombinat (Shingrix) 06/21/2018, 10/09/2018, 01/11/2019    TDAP status: Up to  date  Flu Vaccine status: Up to date  Pneumococcal vaccine status: Up to date  Covid-19 vaccine status: Completed vaccines  Qualifies for Shingles Vaccine? Yes   Zostavax completed No   Shingrix Completed?: Yes  Screening  Tests Health Maintenance  Topic Date Due   COVID-19 Vaccine (5 - 2023-24 season) 05/04/2022   Medicare Annual Wellness (AWV)  07/05/2023   DTaP/Tdap/Td (2 - Td or Tdap) 10/10/2023   Pneumonia Vaccine 71+ Years old  Completed   INFLUENZA VACCINE  Completed   Zoster Vaccines- Shingrix  Completed   HPV VACCINES  Aged Out    Health Maintenance  Health Maintenance Due  Topic Date Due   COVID-19 Vaccine (5 - 2023-24 season) 05/04/2022    Colorectal cancer screening: No longer required.   Lung Cancer Screening: (Low Dose CT Chest recommended if Age 82-80 years, 30 pack-year currently smoking OR have quit w/in 15years.) does not qualify.   Additional Screening:  Hepatitis C Screening: does not qualify; Completed no  Vision Screening: Recommended annual ophthalmology exams for early detection of glaucoma and other disorders of the eye. Is the patient up to date with their annual eye exam?  Yes  Who is the provider or what is the name of the office in which the patient attends annual eye exams? [place] If pt is not established with a provider, would they like to be referred to a provider to establish care? No .   Dental Screening: Recommended annual dental exams for proper oral hygiene  Community Resource Referral / Chronic Care Management: CRR required this visit?  No   CCM required this visit?  No      Plan:     I have personally reviewed and noted the following in the patient's chart:   Medical and social history Use of alcohol, tobacco or illicit drugs  Current medications and supplements including opioid prescriptions. Patient is not currently taking opioid prescriptions. Functional ability and status Nutritional status Physical activity Advanced directives List of other physicians Hospitalizations, surgeries, and ER visits in previous 12 months Vitals Screenings to include cognitive, depression, and falls Referrals and appointments  In addition, I have  reviewed and discussed with patient certain preventive protocols, quality metrics, and best practice recommendations. A written personalized care plan for preventive services as well as general preventive health recommendations were provided to patient.     Dionisio David, LPN   [id number]   Nurse Notes: none

## 2022-07-04 NOTE — Patient Instructions (Signed)
[name] , Thank you for taking time to come for your Medicare Wellness Visit. I appreciate your ongoing commitment to your health goals. Please review the following plan we discussed and let me know if I can assist you in the future.   These are the goals we discussed:  Goals      DIET - EAT MORE FRUITS AND VEGETABLES     Patient Stated     03/27/2020, stay well     Patient Stated     Continue current lifestyle Learn to play banjo     Patient Stated     Continue current lifestyle        This is a list of the screening recommended for you and due dates:  Health Maintenance  Topic Date Due   COVID-19 Vaccine (5 - 2023-24 season) 05/04/2022   Medicare Annual Wellness Visit  07/05/2023   DTaP/Tdap/Td vaccine (2 - Td or Tdap) 10/10/2023   Pneumonia Vaccine  Completed   Flu Shot  Completed   Zoster (Shingles) Vaccine  Completed   HPV Vaccine  Aged Out    Advanced directives: no  Conditions/risks identified: none  Next appointment: Follow up in one year for your annual wellness visit. 07/10/23 @ 9:45 am by phone  Preventive Care 65 Years and Older, Male  Preventive care refers to lifestyle choices and visits with your health care provider that can promote health and wellness. What does preventive care include? A yearly physical exam. This is also called an annual well check. Dental exams once or twice a year. Routine eye exams. Ask your health care provider how often you should have your eyes checked. Personal lifestyle choices, including: Daily care of your teeth and gums. Regular physical activity. Eating a healthy diet. Avoiding tobacco and drug use. Limiting alcohol use. Practicing safe sex. Taking low doses of aspirin every day. Taking vitamin and mineral supplements as recommended by your health care provider. What happens during an annual well check? The services and screenings done by your health care provider during your annual well check will depend on your  age, overall health, lifestyle risk factors, and family history of disease. Counseling  Your health care provider may ask you questions about your: Alcohol use. Tobacco use. Drug use. Emotional well-being. Home and relationship well-being. Sexual activity. Eating habits. History of falls. Memory and ability to understand (cognition). Work and work Statistician. Screening  You may have the following tests or measurements: Height, weight, and BMI. Blood pressure. Lipid and cholesterol levels. These may be checked every 5 years, or more frequently if you are over 39 years old. Skin check. Lung cancer screening. You may have this screening every year starting at age 27 if you have a 30-pack-year history of smoking and currently smoke or have quit within the past 15 years. Fecal occult blood test (FOBT) of the stool. You may have this test every year starting at age 23. Flexible sigmoidoscopy or colonoscopy. You may have a sigmoidoscopy every 5 years or a colonoscopy every 10 years starting at age 50. Prostate cancer screening. Recommendations will vary depending on your family history and other risks. Hepatitis C blood test. Hepatitis B blood test. Sexually transmitted disease (STD) testing. Diabetes screening. This is done by checking your blood sugar (glucose) after you have not eaten for a while (fasting). You may have this done every 1-3 years. Abdominal aortic aneurysm (AAA) screening. You may need this if you are a current or former smoker. Osteoporosis. You may  be screened starting at age 47 if you are at high risk. Talk with your health care provider about your test results, treatment options, and if necessary, the need for more tests. Vaccines  Your health care provider may recommend certain vaccines, such as: Influenza vaccine. This is recommended every year. Tetanus, diphtheria, and acellular pertussis (Tdap, Td) vaccine. You may need a Td booster every 10 years. Zoster  vaccine. You may need this after age 25. Pneumococcal 13-valent conjugate (PCV13) vaccine. One dose is recommended after age 82. Pneumococcal polysaccharide (PPSV23) vaccine. One dose is recommended after age 82. Talk to your health care provider about which screenings and vaccines you need and how often you need them. This information is not intended to replace advice given to you by your health care provider. Make sure you discuss any questions you have with your health care provider. Document Released: 05/22/2015 Document Revised: 01/13/2016 Document Reviewed: 02/24/2015 Elsevier Interactive Patient Education  2017 [place] Prevention in the Home Falls can cause injuries. They can happen to people of all ages. There are many things you can do to make your home safe and to help prevent falls. What can I do on the outside of my home? Regularly fix the edges of walkways and driveways and fix any cracks. Remove anything that might make you trip as you walk through a door, such as a raised step or threshold. Trim any bushes or trees on the path to your home. Use bright outdoor lighting. Clear any walking paths of anything that might make someone trip, such as rocks or tools. Regularly check to see if handrails are loose or broken. Make sure that both sides of any steps have handrails. Any raised decks and porches should have guardrails on the edges. Have any leaves, snow, or ice cleared regularly. Use sand or salt on walking paths during winter. Clean up any spills in your garage right away. This includes oil or grease spills. What can I do in the bathroom? Use night lights. Install grab bars by the toilet and in the tub and shower. Do not use towel bars as grab bars. Use non-skid mats or decals in the tub or shower. If you need to sit down in the shower, use a plastic, non-slip stool. Keep the floor dry. Clean up any water that spills on the floor as soon as it happens. Remove  soap buildup in the tub or shower regularly. Attach bath mats securely with double-sided non-slip rug tape. Do not have throw rugs and other things on the floor that can make you trip. What can I do in the bedroom? Use night lights. Make sure that you have a light by your bed that is easy to reach. Do not use any sheets or blankets that are too big for your bed. They should not hang down onto the floor. Have a firm chair that has side arms. You can use this for support while you get dressed. Do not have throw rugs and other things on the floor that can make you trip. What can I do in the kitchen? Clean up any spills right away. Avoid walking on wet floors. Keep items that you use a lot in easy-to-reach places. If you need to reach something above you, use a strong step stool that has a grab bar. Keep electrical cords out of the way. Do not use floor polish or wax that makes floors slippery. If you must use wax, use non-skid floor wax. Do not  have throw rugs and other things on the floor that can make you trip. What can I do with my stairs? Do not leave any items on the stairs. Make sure that there are handrails on both sides of the stairs and use them. Fix handrails that are broken or loose. Make sure that handrails are as long as the stairways. Check any carpeting to make sure that it is firmly attached to the stairs. Fix any carpet that is loose or worn. Avoid having throw rugs at the top or bottom of the stairs. If you do have throw rugs, attach them to the floor with carpet tape. Make sure that you have a light switch at the top of the stairs and the bottom of the stairs. If you do not have them, ask someone to add them for you. What else can I do to help prevent falls? Wear shoes that: Do not have high heels. Have rubber bottoms. Are comfortable and fit you well. Are closed at the toe. Do not wear sandals. If you use a stepladder: Make sure that it is fully opened. Do not climb a  closed stepladder. Make sure that both sides of the stepladder are locked into place. Ask someone to hold it for you, if possible. Clearly mark and make sure that you can see: Any grab bars or handrails. First and last steps. Where the edge of each step is. Use tools that help you move around (mobility aids) if they are needed. These include: Canes. Walkers. Scooters. Crutches. Turn on the lights when you go into a dark area. Replace any light bulbs as soon as they burn out. Set up your furniture so you have a clear path. Avoid moving your furniture around. If any of your floors are uneven, fix them. If there are any pets around you, be aware of where they are. Review your medicines with your doctor. Some medicines can make you feel dizzy. This can increase your chance of falling. Ask your doctor what other things that you can do to help prevent falls. This information is not intended to replace advice given to you by your health care provider. Make sure you discuss any questions you have with your health care provider. Document Released: 02/19/2009 Document Revised: 10/01/2015 Document Reviewed: 05/30/2014 Elsevier Interactive Patient Education  2017 [employer].

## 2022-08-01 ENCOUNTER — Ambulatory Visit (INDEPENDENT_AMBULATORY_CARE_PROVIDER_SITE_OTHER): Payer: Medicare Other | Admitting: Family Medicine

## 2022-08-01 ENCOUNTER — Encounter: Payer: Self-pay | Admitting: Family Medicine

## 2022-08-01 VITALS — BP 131/75 | HR 79 | Temp 97.9°F | Ht 68.74 in | Wt 186.8 lb

## 2022-08-01 DIAGNOSIS — J302 Other seasonal allergic rhinitis: Secondary | ICD-10-CM

## 2022-08-01 MED ORDER — AZELASTINE HCL 0.1 % NA SOLN: 2.0000 | Freq: Two times a day (BID) | NASAL | Status: DC

## 2022-08-01 MED ORDER — LORATADINE 10 MG PO TABS: 10.0000 mg | ORAL_TABLET | Freq: Every day | ORAL | 0 refills | Status: DC | PRN

## 2022-08-01 MED ORDER — BENZONATATE 100 MG PO CAPS: 100.0000 mg | ORAL_CAPSULE | Freq: Two times a day (BID) | ORAL | 0 refills | Status: DC | PRN

## 2022-08-01 NOTE — Patient Instructions (Addendum)
Take Loratadine daily as needed for allergy symptoms Take Tessalon two times daily as needed for cough Use Astelin nasal spray x2 puffs two times daily as needed for nasal congestion

## 2022-08-01 NOTE — Progress Notes (Signed)
BP 131/75   Pulse 79   Temp 97.9 F (36.6 C) (Oral)   Ht 5' 8.74" (1.746 m)   Wt 186 lb 12.8 oz (84.7 kg)   SpO2 97%   BMI 27.79 kg/m    Subjective:    Patient ID: Carl Fernandez, male    DOB: [birth year], 82 y.o.   MRN: [id number]  HPI: Carl Fernandez is a 82 y.o. male  Chief Complaint  Patient presents with   Cough    Headache, since last visit on 06/16/22. Patient states that he is for a follow up on  the cough which seems to be getting better than last visit    ALLERGIC RHINITIS Patient is taking Claritin and Robitussin cough medicine.  He feels his cold has gotten better since his previous visit last month. He now presents with allergic symptoms.  Started 2 days ago Worst symptom: Cough Fever: no Cough: yes; dry cough Shortness of breath: yes Wheezing: no Chest pain: no Chest tightness: no Chest congestion: yes Nasal congestion: yes Runny nose: yes Post nasal drip: yes Sneezing: yes Sore throat: yes itchy like feeling on Thursday Swollen glands: no Sinus pressure: no Headache:yes; resolved Face pain: no Toothache: no Ear pain: no  Ear pressure: no  Eyes red/itching:yes Eye drainage/crusting: yes  Vomiting: no Rash: no Fatigue: no Sick contacts: yes; thinks wife is sick as well from his previous visit.  Strep contacts: yes  Context: better Recurrent sinusitis: no Relief with OTC cold/cough medications: yes  Alleviating Factors:  Treatments attempted: cold/sinus and cough syrup      Relevant past medical, surgical, family and social history reviewed and updated as indicated. Interim medical history since our last visit reviewed. Allergies and medications reviewed and updated.  Review of Systems  Constitutional:  Negative for chills, fatigue and fever.  HENT:  Positive for congestion, postnasal drip, rhinorrhea and sneezing. Negative for ear pain, sinus pressure, sinus pain and sore throat.        Itchy throat  Eyes:  Positive for discharge and  itching. Negative for redness.  Respiratory:  Positive for cough and shortness of breath. Negative for chest tightness and wheezing.   Cardiovascular:  Negative for chest pain.  Gastrointestinal:  Negative for vomiting.  Skin:  Negative for rash.  Neurological:  Positive for headaches.    Per HPI unless specifically indicated above     Objective:    BP 131/75   Pulse 79   Temp 97.9 F (36.6 C) (Oral)   Ht 5' 8.74" (1.746 m)   Wt 186 lb 12.8 oz (84.7 kg)   SpO2 97%   BMI 27.79 kg/m   Wt Readings from Last 3 Encounters:  08/01/22 186 lb 12.8 oz (84.7 kg)  07/04/22 189 lb (85.7 kg)  06/16/22 189 lb 6.4 oz (85.9 kg)    Physical Exam Vitals and nursing note reviewed.  Constitutional:      General: He is awake. He is not in acute distress.    Appearance: Normal appearance. He is well-developed and well-groomed. He is not ill-appearing.  HENT:     Head: Normocephalic and atraumatic.     Right Ear: Hearing and external ear normal. No drainage.     Left Ear: Hearing and external ear normal. No drainage.     Nose: Congestion present.     Right Turbinates: Pale.     Left Turbinates: Pale.     Mouth/Throat:     Pharynx: Posterior oropharyngeal erythema present.  Eyes:  General: Lids are normal.        Right eye: No discharge.        Left eye: No discharge.     Conjunctiva/sclera: Conjunctivae normal.  Cardiovascular:     Rate and Rhythm: Normal rate and regular rhythm.     Heart sounds: Normal heart sounds, S1 normal and S2 normal. No murmur heard.    No gallop.  Pulmonary:     Effort: Pulmonary effort is normal. No accessory muscle usage or respiratory distress.     Breath sounds: Normal breath sounds.  Musculoskeletal:        General: Normal range of motion.     Cervical back: Full passive range of motion without pain and normal range of motion.     Right lower leg: No edema.     Left lower leg: No edema.  Skin:    General: Skin is warm and dry.     Capillary  Refill: Capillary refill takes less than 2 seconds.  Neurological:     Mental Status: He is alert and oriented to person, place, and time.  Psychiatric:        Attention and Perception: Attention normal.        Mood and Affect: Mood normal.        Speech: Speech normal.        Behavior: Behavior normal. Behavior is cooperative.        Thought Content: Thought content normal.     Results for orders placed or performed in visit on 06/16/22  COVID-19, Flu A+B and RSV   Specimen: Nasal Swab   Nasal Swab  Previously  Result Value Ref Range   SARS-CoV-2, NAA Not Detected Not Detected   Influenza A, NAA Not Detected Not Detected   Influenza B, NAA Not Detected Not Detected   RSV, NAA Not Detected Not Detected   Test Information: Comment       Assessment & Plan:   Problem List Items Addressed This Visit       Respiratory   Allergic rhinitis - Primary    Chronic, ongoing. Start Tessalon for cough and Astelin for nasal congestion. Continue claritin and Flonase as needed. Return if symptoms fail to improve.       Relevant Medications   loratadine (CLARITIN) 10 MG tablet   benzonatate (TESSALON) 100 MG capsule   azelastine (ASTELIN) 0.1 % nasal spray 2 spray     Follow up plan: Return if symptoms worsen or fail to improve, for 3/28 appt for HTN/HLD/COPD follow up.

## 2022-08-01 NOTE — Assessment & Plan Note (Signed)
Chronic, ongoing. Start Tessalon for cough and Astelin for nasal congestion. Continue claritin and Flonase as needed. Return if symptoms fail to improve.

## 2022-08-04 ENCOUNTER — Ambulatory Visit (INDEPENDENT_AMBULATORY_CARE_PROVIDER_SITE_OTHER): Payer: Medicare Other | Admitting: Physician Assistant

## 2022-08-04 ENCOUNTER — Encounter: Payer: Self-pay | Admitting: Physician Assistant

## 2022-08-04 VITALS — BP 123/65 | HR 69 | Temp 98.9°F | Wt 185.5 lb

## 2022-08-04 DIAGNOSIS — K219 Gastro-esophageal reflux disease without esophagitis: Secondary | ICD-10-CM | POA: Diagnosis not present

## 2022-08-04 DIAGNOSIS — J449 Chronic obstructive pulmonary disease, unspecified: Secondary | ICD-10-CM | POA: Diagnosis not present

## 2022-08-04 DIAGNOSIS — I10 Essential (primary) hypertension: Secondary | ICD-10-CM | POA: Diagnosis not present

## 2022-08-04 DIAGNOSIS — E785 Hyperlipidemia, unspecified: Secondary | ICD-10-CM

## 2022-08-04 MED ORDER — OMEPRAZOLE 40 MG PO CPDR: 40.0000 mg | DELAYED_RELEASE_CAPSULE | Freq: Every day | ORAL | 1 refills | Status: DC

## 2022-08-04 MED ORDER — BENAZEPRIL HCL 20 MG PO TABS: 20.0000 mg | ORAL_TABLET | Freq: Every day | ORAL | 4 refills | Status: DC

## 2022-08-04 MED ORDER — ATORVASTATIN CALCIUM 40 MG PO TABS: 40.0000 mg | ORAL_TABLET | Freq: Every day | ORAL | 1 refills | Status: DC

## 2022-08-04 NOTE — Assessment & Plan Note (Signed)
Chronic, historic condition Appears well managed with Omeprazole 40 mg PO QD  He denies flares or concerns today Continue with current regimen Follow up in 6 months or sooner if concerns arise

## 2022-08-04 NOTE — Assessment & Plan Note (Signed)
Chronic, historic condition Appears well managed with Atorvastatin 40 mg PO QD and appears to be tolerating well Continue current regimen Recheck lipid panel today Results to dictate further management Follow up in 6 months or sooner if concerns arise

## 2022-08-04 NOTE — Progress Notes (Signed)
Established Patient Office Visit  Name: Carl Fernandez   MRN: [id number]    DOB: 1940/10/14   Date:08/04/2022  Today's Provider: Talitha Givens, MHS, PA-C Introduced myself to the patient as a PA-C and provided education on APPs in clinical practice.         Subjective  Chief Complaint  Chief Complaint  Patient presents with   Hyperlipidemia   Hypertension    Hyperlipidemia Pertinent negatives include no chest pain or shortness of breath.  Hypertension Pertinent negatives include no blurred vision, chest pain, headaches, palpitations or shortness of breath.     He feels like he is doing better than he was Monday  He has been taking the nasal spray and is unsure if he is taking the Claritin   HYPERTENSION / HYPERLIPIDEMIA Satisfied with current treatment? yes Duration of hypertension: chronic BP monitoring frequency: a few times a month BP range:  BP medication side effects: no Past BP meds: benazepril Duration of hyperlipidemia: years Cholesterol medication side effects: no Cholesterol supplements: none Past cholesterol medications: atorvastain (lipitor) Medication compliance: excellent compliance Aspirin: no Recent stressors: no Recurrent headaches: no Visual changes: no Palpitations: no Dyspnea: no Chest pain: no Lower extremity edema: no Dizzy/lightheaded: no   COPD COPD status: stable Satisfied with current treatment?: yes Oxygen use: no Dyspnea frequency: none  Cough frequency:  not often, seldom is a concern for him  Rescue inhaler frequency:  very infrequent. "Almost never"  Limitation of activity: no Productive cough: occasionally  Last Spirometry:  Pneumovax: Up to Date Influenza: Up to Date  GERD GERD control status: controlled Satisfied with current treatment? yes Heartburn frequency: "almost never"  Medication side effects: no  Medication compliance: better and stable Previous GERD medications: he is taking Prilosec  Antacid use  frequency:  infrequent Duration:  Nature: usually gets a burning sensation in chest  Location:  Heartburn duration:  Alleviatiating factors:   Aggravating factors: coconut seems to aggravate  Dysphagia: no Odynophagia:  no Hematemesis: no Blood in stool: no EGD:  Unsure  but thinks he's had it done in the past    Patient Active Problem List   Diagnosis Date Noted   Hyperlipidemia 02/03/2021   Nocturia 02/03/2021   Essential hypertension 01/31/2017   Advanced care planning/counseling discussion 01/31/2017   Hypercholesteremia 05/23/2016   Allergic rhinitis 10/29/2014   COPD (chronic obstructive pulmonary disease) (HCC)    OSA on CPAP    Hard of hearing    GERD (gastroesophageal reflux disease)    Mild intermittent asthma without complication 0000000    Past Surgical History:  Procedure Laterality Date   LITHOTRIPSY     nasoplasty      Family History  Problem Relation Age of Onset   Hypertension Mother    Osteoporosis Mother    Cancer Mother        skin on her nose   Heart disease Father     Social History   Tobacco Use   Smoking status: Never   Smokeless tobacco: Never  Substance Use Topics   Alcohol use: No     Current Outpatient Medications:    albuterol (VENTOLIN HFA) 108 (90 Base) MCG/ACT inhaler, Inhale 2 puffs into the lungs every 6 (six) hours as needed for wheezing or shortness of breath., Disp: 18 g, Rfl: 0   azelastine (ASTELIN) 0.1 % nasal spray, Place into both nostrils., Disp: , Rfl:    benzonatate (TESSALON) 100 MG capsule, Take 1 capsule (  100 mg total) by mouth 2 (two) times daily as needed for cough., Disp: 20 capsule, Rfl: 0   fluticasone (FLONASE) 50 MCG/ACT nasal spray, Place into the nose., Disp: , Rfl:    fluticasone-salmeterol (ADVAIR HFA) 115-21 MCG/ACT inhaler, Inhale 2 puffs into the lungs 2 (two) times daily., Disp: 12 g, Rfl: 2   loratadine (CLARITIN) 10 MG tablet, Take 1 tablet (10 mg total) by mouth daily as needed for  allergies., Disp: 30 tablet, Rfl: 0   montelukast (SINGULAIR) 10 MG tablet, Take 1 tablet (10 mg total) by mouth at bedtime., Disp: 90 tablet, Rfl: 2   atorvastatin (LIPITOR) 40 MG tablet, Take 1 tablet (40 mg total) by mouth daily., Disp: 90 tablet, Rfl: 1   benazepril (LOTENSIN) 20 MG tablet, Take 1 tablet (20 mg total) by mouth daily., Disp: 30 tablet, Rfl: 4   omeprazole (PRILOSEC) 40 MG capsule, Take 1 capsule (40 mg total) by mouth daily., Disp: 90 capsule, Rfl: 1  Allergies  Allergen Reactions   Amoxicillin    Penicillin G     Other reaction(s): Unknown    I personally reviewed active problem list, medication list, allergies, health maintenance, notes from last encounter, lab results with the patient/caregiver today.   Review of Systems  Constitutional:  Negative for chills and fever.  Eyes:  Negative for blurred vision and double vision.  Respiratory:  Positive for cough (Intermittent). Negative for shortness of breath and wheezing.   Cardiovascular:  Negative for chest pain, palpitations and leg swelling.  Gastrointestinal:  Negative for blood in stool, diarrhea, heartburn, nausea and vomiting.  Neurological:  Negative for dizziness and headaches.      Objective  Vitals:   08/04/22 0810  BP: 123/65  Pulse: 69  Temp: 98.9 F (37.2 C)  TempSrc: Oral  SpO2: 97%  Weight: 185 lb 8 oz (84.1 kg)    Body mass index is 27.6 kg/m.  Physical Exam Vitals reviewed.  Constitutional:      General: He is awake.     Appearance: Normal appearance. He is well-developed and well-groomed.  HENT:     Head: Normocephalic and atraumatic.  Eyes:     General: Lids are normal. Gaze aligned appropriately.     Extraocular Movements: Extraocular movements intact.     Conjunctiva/sclera: Conjunctivae normal.  Cardiovascular:     Rate and Rhythm: Normal rate and regular rhythm.     Pulses: Normal pulses.          Radial pulses are 2+ on the right side and 2+ on the left side.      Heart sounds: Normal heart sounds. No murmur heard.    No friction rub. No gallop.  Pulmonary:     Effort: Pulmonary effort is normal.     Breath sounds: Normal breath sounds. No decreased air movement. No decreased breath sounds, wheezing, rhonchi or rales.  Musculoskeletal:     Cervical back: Normal range of motion.     Right lower leg: No edema.     Left lower leg: No edema.  Lymphadenopathy:     Head:     Right side of head: No submental, submandibular or preauricular adenopathy.     Left side of head: No submental, submandibular or preauricular adenopathy.     Cervical:     Right cervical: No superficial or posterior cervical adenopathy.    Left cervical: No superficial or posterior cervical adenopathy.     Upper Body:     Right upper body: No  supraclavicular adenopathy.     Left upper body: No supraclavicular adenopathy.  Neurological:     Mental Status: He is alert.  Psychiatric:        Mood and Affect: Mood and affect normal.        Speech: Speech normal.        Behavior: Behavior normal. Behavior is cooperative.        Thought Content: Thought content normal.        Judgment: Judgment normal.     Comments: he seemed to have difficulty remembering medications and what they were for while discussing HPI and reviewing chronic health conditions      Recent Results (from the past 2160 hour(s))  COVID-19, Flu A+B and RSV     Status: None   Collection Time: 06/16/22 10:37 AM   Specimen: Nasal Swab   Nasal Swab  Previously  Result Value Ref Range   SARS-CoV-2, NAA Not Detected Not Detected   Influenza A, NAA Not Detected Not Detected   Influenza B, NAA Not Detected Not Detected   RSV, NAA Not Detected Not Detected   Test Information: Comment     Comment: This nucleic acid amplification test was developed and its performance characteristics determined by [employer]. Nucleic acid amplification tests include RT-PCR and TMA. This test has not been FDA cleared or  approved. This test has been authorized by FDA under an Emergency Use Authorization (EUA). This test is only authorized for the duration of time the declaration that circumstances exist justifying the authorization of the emergency use of in vitro diagnostic tests for detection of SARS-CoV-2 virus and/or diagnosis of COVID-19 infection under section 564(b)(1) of the Act, 21 U.S.C. [id number]) (1), unless the authorization is terminated or revoked sooner. When diagnostic testing is negative, the possibility of a false negative result should be considered in the context of a patient's recent exposures and the presence of clinical signs and symptoms consistent with COVID-19. An individual without symptoms of COVID-19 and who is not shedding SARS-CoV-2 virus wo uld expect to have a negative (not detected) result in this assay.      PHQ2/9:    08/04/2022    8:17 AM 07/04/2022    9:50 AM 06/16/2022    9:38 AM 02/03/2022    9:33 AM 08/03/2021    2:41 PM  Depression screen PHQ 2/9  Decreased Interest 0 0 0 0 0  Down, Depressed, Hopeless 0 0 0 0 0  PHQ - 2 Score 0 0 0 0 0  Altered sleeping 0 0 0 1 0  Tired, decreased energy 1 0 0 1 1  Change in appetite 0 0 0 0 0  Feeling bad or failure about yourself  0 0 0 0 1  Trouble concentrating 0 0 0 0 0  Moving slowly or fidgety/restless 0 0 0 0 1  Suicidal thoughts 0 0 0 0 0  PHQ-9 Score 1 0 0 2 3  Difficult doing work/chores Not difficult at all Not difficult at all Not difficult at all Not difficult at all Not difficult at all      Fall Risk:    08/04/2022    8:16 AM 07/04/2022    9:52 AM 06/16/2022    9:38 AM 02/03/2022    9:34 AM 08/03/2021    2:39 PM  Fall Risk   Falls in the past year? 0 0 0 0 0  Number falls in past yr: 0 0 0 0 0  Injury with Fall? 0  0 0 0 0  Risk for fall due to : No Fall Risks No Fall Risks No Fall Risks No Fall Risks No Fall Risks  Follow up Falls evaluation completed Falls prevention discussed;Falls evaluation  completed Falls prevention discussed;Education provided;Falls evaluation completed Falls evaluation completed Falls evaluation completed      Functional Status Survey:      Assessment & Plan  Problem List Items Addressed This Visit       Cardiovascular and Mediastinum   Essential hypertension    Chronic, historic condition Appears well managed with Benazepril 20 mg PO QD  BP appears in goal in office today  Continue current medication regimen Follow up in 6 months for monitoring or sooner if concerns arise      Relevant Medications   atorvastatin (LIPITOR) 40 MG tablet   benazepril (LOTENSIN) 20 MG tablet   Other Relevant Orders   Comp Met (CMET)   CBC w/Diff   TSH     Respiratory   COPD (chronic obstructive pulmonary disease) (HCC) - Primary    Chronic, historic conditions Unable to find PFTs for confirmation of disease He very seldom uses his rescue inhaler and is consistent with his Advair use He appears to have plenty of refills at this time  Continue current regimen Follow up in 6 months or sooner if needed          Digestive   GERD (gastroesophageal reflux disease)    Chronic, historic condition Appears well managed with Omeprazole 40 mg PO QD  He denies flares or concerns today Continue with current regimen Follow up in 6 months or sooner if concerns arise       Relevant Medications   omeprazole (PRILOSEC) 40 MG capsule     Other   Hyperlipidemia    Chronic, historic condition Appears well managed with Atorvastatin 40 mg PO QD and appears to be tolerating well Continue current regimen Recheck lipid panel today Results to dictate further management Follow up in 6 months or sooner if concerns arise       Relevant Medications   atorvastatin (LIPITOR) 40 MG tablet   benazepril (LOTENSIN) 20 MG tablet   Other Relevant Orders   Lipid Profile     Return in about 6 months (around 02/04/2023) for HTN, HLD, GERD, COPD follow up .   I, Mahmood Boehringer E  Dymphna Wadley, PA-C, have reviewed all documentation for this visit. The documentation on 08/04/22 for the exam, diagnosis, procedures, and orders are all accurate and complete.   Talitha Givens, MHS, PA-C [facility]

## 2022-08-04 NOTE — Assessment & Plan Note (Addendum)
Chronic, historic conditions Unable to find PFTs for confirmation of disease He very seldom uses his rescue inhaler and is consistent with his Advair use He appears to have plenty of refills at this time  Continue current regimen Follow up in 6 months or sooner if needed

## 2022-08-04 NOTE — Assessment & Plan Note (Signed)
Chronic, historic condition Appears well managed with Benazepril 20 mg PO QD  BP appears in goal in office today  Continue current medication regimen Follow up in 6 months for monitoring or sooner if concerns arise

## 2022-08-05 LAB — COMPREHENSIVE METABOLIC PANEL
ALT: 16 IU/L (ref 0–44)
AST: 19 IU/L (ref 0–40)
Albumin/Globulin Ratio: 1.7 (ref 1.2–2.2)
Albumin: 3.9 g/dL (ref 3.7–4.7)
Alkaline Phosphatase: 84 IU/L (ref 44–121)
BUN/Creatinine Ratio: 12 (ref 10–24)
BUN: 13 mg/dL (ref 8–27)
Bilirubin Total: 0.6 mg/dL (ref 0.0–1.2)
CO2: 25 mmol/L (ref 20–29)
Calcium: 8.7 mg/dL (ref 8.6–10.2)
Chloride: 106 mmol/L (ref 96–106)
Creatinine, Ser: 1.06 mg/dL (ref 0.76–1.27)
Globulin, Total: 2.3 g/dL (ref 1.5–4.5)
Glucose: 87 mg/dL (ref 70–99)
Potassium: 4.3 mmol/L (ref 3.5–5.2)
Sodium: 143 mmol/L (ref 134–144)
Total Protein: 6.2 g/dL (ref 6.0–8.5)
eGFR: 70 mL/min/{1.73_m2} (ref >59)

## 2022-08-05 LAB — CBC WITH DIFFERENTIAL/PLATELET
Basophils Absolute: 0 10*3/uL (ref 0.0–0.2)
Basos: 0 %
EOS (ABSOLUTE): 0.2 10*3/uL (ref 0.0–0.4)
Eos: 4 %
Hematocrit: 39.9 % (ref 37.5–51.0)
Hemoglobin: 13 g/dL (ref 13.0–17.7)
Immature Grans (Abs): 0 10*3/uL (ref 0.0–0.1)
Immature Granulocytes: 0 %
Lymphocytes Absolute: 1.1 10*3/uL (ref 0.7–3.1)
Lymphs: 22 %
MCH: 29.3 pg (ref 26.6–33.0)
MCHC: 32.6 g/dL (ref 31.5–35.7)
MCV: 90 fL (ref 79–97)
Monocytes Absolute: 0.4 10*3/uL (ref 0.1–0.9)
Monocytes: 8 %
Neutrophils Absolute: 3.2 10*3/uL (ref 1.4–7.0)
Neutrophils: 66 %
Platelets: 185 10*3/uL (ref 150–450)
RBC: 4.44 x10E6/uL (ref 4.14–5.80)
RDW: 12.1 % (ref 11.6–15.4)
WBC: 5 10*3/uL (ref 3.4–10.8)

## 2022-08-05 LAB — LIPID PANEL
Chol/HDL Ratio: 3.4 ratio (ref 0.0–5.0)
Cholesterol, Total: 96 mg/dL — ABNORMAL LOW (ref 100–199)
HDL: 28 mg/dL — ABNORMAL LOW (ref >39)
LDL Chol Calc (NIH): 51 mg/dL (ref 0–99)
Triglycerides: 83 mg/dL (ref 0–149)
VLDL Cholesterol Cal: 17 mg/dL (ref 5–40)

## 2022-08-05 LAB — TSH: TSH: 1.7 u[IU]/mL (ref 0.450–4.500)

## 2022-08-09 NOTE — Progress Notes (Signed)
Labs are normal/stable.

## 2022-09-05 ENCOUNTER — Other Ambulatory Visit: Payer: Self-pay | Admitting: Physician Assistant

## 2022-09-05 DIAGNOSIS — J42 Unspecified chronic bronchitis: Secondary | ICD-10-CM

## 2022-09-06 NOTE — Telephone Encounter (Signed)
Future visit in 5 months . Requested Prescriptions  Pending Prescriptions Disp Refills   ADVAIR HFA 115-21 MCG/ACT inhaler [Pharmacy Med Name: ADVAIR HFA 115-21 MCG INHALER] 12 g 0    Sig: Inhale 2 puffs into the lungs 2 (two) times daily.     Pulmonology:  Combination Products Passed - 09/05/2022  7:14 AM      Passed - Valid encounter within last 12 months    Recent Outpatient Visits           1 month ago Chronic obstructive pulmonary disease, unspecified COPD type (HCC)   [place] [facility] Mecum, Erin E, PA-C   1 month ago Seasonal allergic rhinitis, unspecified trigger   [place] [facility] Pearley, Sherran Needs, NP   2 months ago Upper respiratory tract infection, unspecified type   [place] [facility] Mecum, Erin E, PA-C   7 months ago Hyperlipidemia, unspecified hyperlipidemia type   [place] [facility] Mecum, Erin E, PA-C   1 year ago Chronic bronchitis, unspecified chronic bronchitis type (HCC)   [place] [facility] Vigg, Avanti, MD       Future Appointments             In 5 months Laural Benes, Oralia Rud, DO  [facility] [place], [facility]

## 2022-10-03 ENCOUNTER — Other Ambulatory Visit: Payer: Self-pay | Admitting: Physician Assistant

## 2022-10-03 DIAGNOSIS — J42 Unspecified chronic bronchitis: Secondary | ICD-10-CM

## 2022-10-04 NOTE — Telephone Encounter (Signed)
Requested Prescriptions  Pending Prescriptions Disp Refills   ADVAIR HFA 115-21 MCG/ACT inhaler [Pharmacy Med Name: ADVAIR HFA 115-21 MCG INHALER] 12 g 0    Sig: Inhale 2 puffs into the lungs 2 (two) times daily.     Pulmonology:  Combination Products Passed - 10/03/2022 11:06 AM      Passed - Valid encounter within last 12 months    Recent Outpatient Visits           2 months ago Chronic obstructive pulmonary disease, unspecified COPD type (HCC)   [place] [facility] Mecum, Erin E, PA-C   2 months ago Seasonal allergic rhinitis, unspecified trigger   [place] [facility] Pearley, Sherran Needs, NP   3 months ago Upper respiratory tract infection, unspecified type   [place] [facility] Mecum, Erin E, PA-C   8 months ago Hyperlipidemia, unspecified hyperlipidemia type   [place] [facility] Mecum, Erin E, PA-C   1 year ago Chronic bronchitis, unspecified chronic bronchitis type (HCC)   [place] [facility] Vigg, Avanti, MD       Future Appointments             In 4 months Laural Benes, Oralia Rud, DO [facility], [facility]

## 2022-10-25 DIAGNOSIS — J4520 Mild intermittent asthma, uncomplicated: Secondary | ICD-10-CM | POA: Diagnosis not present

## 2022-10-25 DIAGNOSIS — J301 Allergic rhinitis due to pollen: Secondary | ICD-10-CM | POA: Diagnosis not present

## 2022-10-25 DIAGNOSIS — G4733 Obstructive sleep apnea (adult) (pediatric): Secondary | ICD-10-CM | POA: Diagnosis not present

## 2022-10-31 ENCOUNTER — Other Ambulatory Visit: Payer: Self-pay | Admitting: Physician Assistant

## 2022-10-31 DIAGNOSIS — J42 Unspecified chronic bronchitis: Secondary | ICD-10-CM

## 2022-11-01 NOTE — Telephone Encounter (Signed)
Requested Prescriptions  Pending Prescriptions Disp Refills   ADVAIR HFA 115-21 MCG/ACT inhaler [Pharmacy Med Name: ADVAIR HFA 115-21 MCG INHALER] 12 g 2    Sig: Inhale 2 puffs into the lungs 2 (two) times daily.     Pulmonology:  Combination Products Passed - 10/31/2022  7:27 AM      Passed - Valid encounter within last 12 months    Recent Outpatient Visits           2 months ago Chronic obstructive pulmonary disease, unspecified COPD type (HCC)   [place] [facility] Mecum, Erin E, PA-C   3 months ago Seasonal allergic rhinitis, unspecified trigger   [place] [facility] Pearley, Sherran Needs, NP   4 months ago Upper respiratory tract infection, unspecified type   [place] [facility] Mecum, Erin E, PA-C   9 months ago Hyperlipidemia, unspecified hyperlipidemia type   [place] [facility] Mecum, Erin E, PA-C   1 year ago Chronic bronchitis, unspecified chronic bronchitis type (HCC)   [place] [facility] Vigg, Avanti, MD       Future Appointments             In 3 months Johnson, Oralia Rud, DO [place] [facility], [facility]

## 2022-11-17 DIAGNOSIS — Z08 Encounter for follow-up examination after completed treatment for malignant neoplasm: Secondary | ICD-10-CM | POA: Diagnosis not present

## 2022-11-17 DIAGNOSIS — L821 Other seborrheic keratosis: Secondary | ICD-10-CM | POA: Diagnosis not present

## 2022-11-17 DIAGNOSIS — L570 Actinic keratosis: Secondary | ICD-10-CM | POA: Diagnosis not present

## 2022-11-17 DIAGNOSIS — D225 Melanocytic nevi of trunk: Secondary | ICD-10-CM | POA: Diagnosis not present

## 2022-11-17 DIAGNOSIS — Z85828 Personal history of other malignant neoplasm of skin: Secondary | ICD-10-CM | POA: Diagnosis not present

## 2023-01-24 DIAGNOSIS — H40003 Preglaucoma, unspecified, bilateral: Secondary | ICD-10-CM | POA: Diagnosis not present

## 2023-01-24 DIAGNOSIS — H353131 Nonexudative age-related macular degeneration, bilateral, early dry stage: Secondary | ICD-10-CM | POA: Diagnosis not present

## 2023-01-24 DIAGNOSIS — H2513 Age-related nuclear cataract, bilateral: Secondary | ICD-10-CM | POA: Diagnosis not present

## 2023-02-06 ENCOUNTER — Ambulatory Visit (INDEPENDENT_AMBULATORY_CARE_PROVIDER_SITE_OTHER): Payer: Medicare Other | Admitting: Family Medicine

## 2023-02-06 ENCOUNTER — Encounter: Payer: Self-pay | Admitting: Family Medicine

## 2023-02-06 VITALS — BP 134/73 | HR 61 | Temp 97.7°F | Wt 184.6 lb

## 2023-02-06 DIAGNOSIS — K219 Gastro-esophageal reflux disease without esophagitis: Secondary | ICD-10-CM

## 2023-02-06 DIAGNOSIS — Z23 Encounter for immunization: Secondary | ICD-10-CM

## 2023-02-06 DIAGNOSIS — I10 Essential (primary) hypertension: Secondary | ICD-10-CM | POA: Diagnosis not present

## 2023-02-06 DIAGNOSIS — J42 Unspecified chronic bronchitis: Secondary | ICD-10-CM

## 2023-02-06 DIAGNOSIS — E785 Hyperlipidemia, unspecified: Secondary | ICD-10-CM

## 2023-02-06 MED ORDER — ALBUTEROL SULFATE HFA 108 (90 BASE) MCG/ACT IN AERS
2.0000 | INHALATION_SPRAY | Freq: Four times a day (QID) | RESPIRATORY_TRACT | 0 refills | Status: DC | PRN
Start: 2023-02-06 — End: 2023-08-07

## 2023-02-06 MED ORDER — FLUTICASONE-SALMETEROL 115-21 MCG/ACT IN AERO
2.0000 | INHALATION_SPRAY | Freq: Two times a day (BID) | RESPIRATORY_TRACT | 2 refills | Status: DC
Start: 2023-02-06 — End: 2023-04-28

## 2023-02-06 MED ORDER — BENAZEPRIL HCL 20 MG PO TABS: 20.0000 mg | ORAL_TABLET | Freq: Every day | ORAL | 1 refills | Status: DC

## 2023-02-06 MED ORDER — ATORVASTATIN CALCIUM 40 MG PO TABS: 40.0000 mg | ORAL_TABLET | Freq: Every day | ORAL | 1 refills | Status: DC

## 2023-02-06 MED ORDER — OMEPRAZOLE 40 MG PO CPDR: 40.0000 mg | DELAYED_RELEASE_CAPSULE | Freq: Every day | ORAL | 1 refills | Status: DC

## 2023-02-06 MED ORDER — MONTELUKAST SODIUM 10 MG PO TABS
10.0000 mg | ORAL_TABLET | Freq: Every day | ORAL | 2 refills | Status: DC
Start: 2023-02-06 — End: 2023-08-07

## 2023-02-06 NOTE — Assessment & Plan Note (Signed)
Under good control on current regimen. Continue current regimen. Continue to monitor. Call with any concerns. Refills given. Labs drawn today.   

## 2023-02-06 NOTE — Progress Notes (Signed)
BP 134/73   Pulse 61   Temp 97.7 F (36.5 C) (Oral)   Wt 184 lb 9.6 oz (83.7 kg)   SpO2 96%   BMI 27.47 kg/m    Subjective:    Patient ID: Carl Fernandez, male    DOB: August 14, 1940, 82 y.o.   MRN: [id number]  HPI: [name] is a 82 y.o. male  Chief Complaint  Patient presents with   Hypertension   Hyperlipidemia   HYPERTENSION / HYPERLIPIDEMIA Satisfied with current treatment? yes Duration of hypertension: chronic BP monitoring frequency: not checking BP medication side effects: no Past BP meds: benazepril Duration of hyperlipidemia: chronic Cholesterol medication side effects: no Cholesterol supplements: none Past cholesterol medications: atorvastatin Medication compliance: excellent compliance Aspirin: no Recent stressors: no Recurrent headaches: no Visual changes: no Palpitations: no Dyspnea: no Chest pain: no Lower extremity edema: no Dizzy/lightheaded: no  COPD COPD status: controlled Satisfied with current treatment?: yes Oxygen use: no Dyspnea frequency: rarely Cough frequency: rarely Rescue inhaler frequency: rarely  Limitation of activity: no Productive cough: no Pneumovax: Up to Date Influenza: Up to Date   Relevant past medical, surgical, family and social history reviewed and updated as indicated. Interim medical history since our last visit reviewed. Allergies and medications reviewed and updated.  Review of Systems  Constitutional: Negative.   Respiratory: Negative.    Cardiovascular: Negative.   Musculoskeletal:  Positive for neck stiffness. Negative for arthralgias, back pain, gait problem, joint swelling, myalgias and neck pain.  Psychiatric/Behavioral: Negative.      Per HPI unless specifically indicated above     Objective:    BP 134/73   Pulse 61   Temp 97.7 F (36.5 C) (Oral)   Wt 184 lb 9.6 oz (83.7 kg)   SpO2 96%   BMI 27.47 kg/m   Wt Readings from Last 3 Encounters:  02/06/23 184 lb 9.6 oz (83.7 kg)  08/04/22  185 lb 8 oz (84.1 kg)  08/01/22 186 lb 12.8 oz (84.7 kg)    Physical Exam Vitals and nursing note reviewed.  Constitutional:      General: He is not in acute distress.    Appearance: Normal appearance. He is not ill-appearing, toxic-appearing or diaphoretic.  HENT:     Head: Normocephalic and atraumatic.     Right Ear: External ear normal.     Left Ear: External ear normal.     Nose: Nose normal.     Mouth/Throat:     Mouth: Mucous membranes are moist.     Pharynx: Oropharynx is clear.  Eyes:     General: No scleral icterus.       Right eye: No discharge.        Left eye: No discharge.     Extraocular Movements: Extraocular movements intact.     Conjunctiva/sclera: Conjunctivae normal.     Pupils: Pupils are equal, round, and reactive to light.  Cardiovascular:     Rate and Rhythm: Normal rate and regular rhythm.     Pulses: Normal pulses.     Heart sounds: Normal heart sounds. No murmur heard.    No friction rub. No gallop.  Pulmonary:     Effort: Pulmonary effort is normal. No respiratory distress.     Breath sounds: Normal breath sounds. No stridor. No wheezing, rhonchi or rales.  Chest:     Chest wall: No tenderness.  Musculoskeletal:        General: Normal range of motion.     Cervical back: Normal range of  motion and neck supple.     Comments: Hypertonic trap on L side  Skin:    General: Skin is warm and dry.     Capillary Refill: Capillary refill takes less than 2 seconds.     Coloration: Skin is not jaundiced or pale.     Findings: No bruising, erythema, lesion or rash.  Neurological:     General: No focal deficit present.     Mental Status: He is alert and oriented to person, place, and time. Mental status is at baseline.  Psychiatric:        Mood and Affect: Mood normal.        Behavior: Behavior normal.        Thought Content: Thought content normal.        Judgment: Judgment normal.     Results for orders placed or performed in visit on 08/04/22  Comp  Met (CMET)  Result Value Ref Range   Glucose 87 70 - 99 mg/dL   BUN 13 8 - 27 mg/dL   Creatinine, Ser 7.82 0.76 - 1.27 mg/dL   eGFR 70 >95 AO/ZHY/8.65   BUN/Creatinine Ratio 12 10 - 24   Sodium 143 134 - 144 mmol/L   Potassium 4.3 3.5 - 5.2 mmol/L   Chloride 106 96 - 106 mmol/L   CO2 25 20 - 29 mmol/L   Calcium 8.7 8.6 - 10.2 mg/dL   Total Protein 6.2 6.0 - 8.5 g/dL   Albumin 3.9 3.7 - 4.7 g/dL   Globulin, Total 2.3 1.5 - 4.5 g/dL   Albumin/Globulin Ratio 1.7 1.2 - 2.2   Bilirubin Total 0.6 0.0 - 1.2 mg/dL   Alkaline Phosphatase 84 44 - 121 IU/L   AST 19 0 - 40 IU/L   ALT 16 0 - 44 IU/L  CBC w/Diff  Result Value Ref Range   WBC 5.0 3.4 - 10.8 x10E3/uL   RBC 4.44 4.14 - 5.80 x10E6/uL   Hemoglobin 13.0 13.0 - 17.7 g/dL   Hematocrit 78.4 69.6 - 51.0 %   MCV 90 79 - 97 fL   MCH 29.3 26.6 - 33.0 pg   MCHC 32.6 31.5 - 35.7 g/dL   RDW 29.5 28.4 - 13.2 %   Platelets 185 150 - 450 x10E3/uL   Neutrophils 66 Not Estab. %   Lymphs 22 Not Estab. %   Monocytes 8 Not Estab. %   Eos 4 Not Estab. %   Basos 0 Not Estab. %   Neutrophils Absolute 3.2 1.4 - 7.0 x10E3/uL   Lymphocytes Absolute 1.1 0.7 - 3.1 x10E3/uL   Monocytes Absolute 0.4 0.1 - 0.9 x10E3/uL   EOS (ABSOLUTE) 0.2 0.0 - 0.4 x10E3/uL   Basophils Absolute 0.0 0.0 - 0.2 x10E3/uL   Immature Granulocytes 0 Not Estab. %   Immature Grans (Abs) 0.0 0.0 - 0.1 x10E3/uL  Lipid Profile  Result Value Ref Range   Cholesterol, Total 96 (L) 100 - 199 mg/dL   Triglycerides 83 0 - 149 mg/dL   HDL 28 (L) >44 mg/dL   VLDL Cholesterol Cal 17 5 - 40 mg/dL   LDL Chol Calc (NIH) 51 0 - 99 mg/dL   Chol/HDL Ratio 3.4 0.0 - 5.0 ratio  TSH  Result Value Ref Range   TSH 1.700 0.450 - 4.500 uIU/mL      Assessment & Plan:   Problem List Items Addressed This Visit       Cardiovascular and Mediastinum   Essential hypertension - Primary    Under  good control on current regimen. Continue current regimen. Continue to monitor. Call with any  concerns. Refills given. Labs drawn today.        Relevant Medications   atorvastatin (LIPITOR) 40 MG tablet   benazepril (LOTENSIN) 20 MG tablet   Other Relevant Orders   Comprehensive metabolic panel     Respiratory   COPD (chronic obstructive pulmonary disease) (HCC)    Under good control on current regimen. Continue current regimen. Continue to monitor. Call with any concerns. Refills given.  Labs drawn today.       Relevant Medications   albuterol (VENTOLIN HFA) 108 (90 Base) MCG/ACT inhaler   fluticasone-salmeterol (ADVAIR HFA) 115-21 MCG/ACT inhaler   montelukast (SINGULAIR) 10 MG tablet   Other Relevant Orders   CBC with Differential/Platelet   Comprehensive metabolic panel     Digestive   GERD (gastroesophageal reflux disease)    Under good control on current regimen. Continue current regimen. Continue to monitor. Call with any concerns. Refills given. Labs drawn today.       Relevant Medications   omeprazole (PRILOSEC) 40 MG capsule   Other Relevant Orders   CBC with Differential/Platelet   Comprehensive metabolic panel     Other   Hyperlipidemia    Under good control on current regimen. Continue current regimen. Continue to monitor. Call with any concerns. Refills given. Labs drawn today.       Relevant Medications   atorvastatin (LIPITOR) 40 MG tablet   benazepril (LOTENSIN) 20 MG tablet   Other Relevant Orders   Lipid Panel w/o Chol/HDL Ratio   Comprehensive metabolic panel   Other Visit Diagnoses     Needs flu shot       Flu shot given today.   Relevant Orders   Flu Vaccine Trivalent High Dose (Fluad) (Completed)   Need for COVID-19 vaccine       COVID shot given today.   Relevant Orders   Pfizer Comirnaty Covid -19 Vaccine 13yrs and older (Completed)        Follow up plan: Return in about 6 months (around 08/06/2023).

## 2023-02-07 LAB — LIPID PANEL W/O CHOL/HDL RATIO
Cholesterol, Total: 101 mg/dL (ref 100–199)
HDL: 38 mg/dL — ABNORMAL LOW (ref >39)
LDL Chol Calc (NIH): 47 mg/dL (ref 0–99)
Triglycerides: 80 mg/dL (ref 0–149)
VLDL Cholesterol Cal: 16 mg/dL (ref 5–40)

## 2023-02-07 LAB — COMPREHENSIVE METABOLIC PANEL
ALT: 9 [IU]/L (ref 0–44)
AST: 14 [IU]/L (ref 0–40)
Albumin: 4.1 g/dL (ref 3.7–4.7)
Alkaline Phosphatase: 89 [IU]/L (ref 44–121)
BUN/Creatinine Ratio: 11 (ref 10–24)
BUN: 13 mg/dL (ref 8–27)
Bilirubin Total: 0.8 mg/dL (ref 0.0–1.2)
CO2: 25 mmol/L (ref 20–29)
Calcium: 8.8 mg/dL (ref 8.6–10.2)
Chloride: 110 mmol/L — ABNORMAL HIGH (ref 96–106)
Creatinine, Ser: 1.17 mg/dL (ref 0.76–1.27)
Globulin, Total: 2.3 g/dL (ref 1.5–4.5)
Glucose: 89 mg/dL (ref 70–99)
Potassium: 4.1 mmol/L (ref 3.5–5.2)
Sodium: 146 mmol/L — ABNORMAL HIGH (ref 134–144)
Total Protein: 6.4 g/dL (ref 6.0–8.5)
eGFR: 62 mL/min/{1.73_m2} (ref >59)

## 2023-02-07 LAB — CBC WITH DIFFERENTIAL/PLATELET
Basophils Absolute: 0 10*3/uL (ref 0.0–0.2)
Basos: 0 %
EOS (ABSOLUTE): 0.2 10*3/uL (ref 0.0–0.4)
Eos: 2 %
Hematocrit: 41.9 % (ref 37.5–51.0)
Hemoglobin: 13.3 g/dL (ref 13.0–17.7)
Immature Grans (Abs): 0 10*3/uL (ref 0.0–0.1)
Immature Granulocytes: 0 %
Lymphocytes Absolute: 1.1 10*3/uL (ref 0.7–3.1)
Lymphs: 17 %
MCH: 29.2 pg (ref 26.6–33.0)
MCHC: 31.7 g/dL (ref 31.5–35.7)
MCV: 92 fL (ref 79–97)
Monocytes Absolute: 0.5 10*3/uL (ref 0.1–0.9)
Monocytes: 7 %
Neutrophils Absolute: 4.9 10*3/uL (ref 1.4–7.0)
Neutrophils: 74 %
Platelets: 175 10*3/uL (ref 150–450)
RBC: 4.55 x10E6/uL (ref 4.14–5.80)
RDW: 12.5 % (ref 11.6–15.4)
WBC: 6.6 10*3/uL (ref 3.4–10.8)

## 2023-03-11 IMAGING — DX DG CHEST 2V
2 series · 2 of 2 positions shown · non-contrast
Comparison: Remote radiograph 03/20/2006.

CLINICAL DATA: Post COVID cough.  COVID ended [place].

EXAM:
CHEST - 2 VIEW

[chest pa]
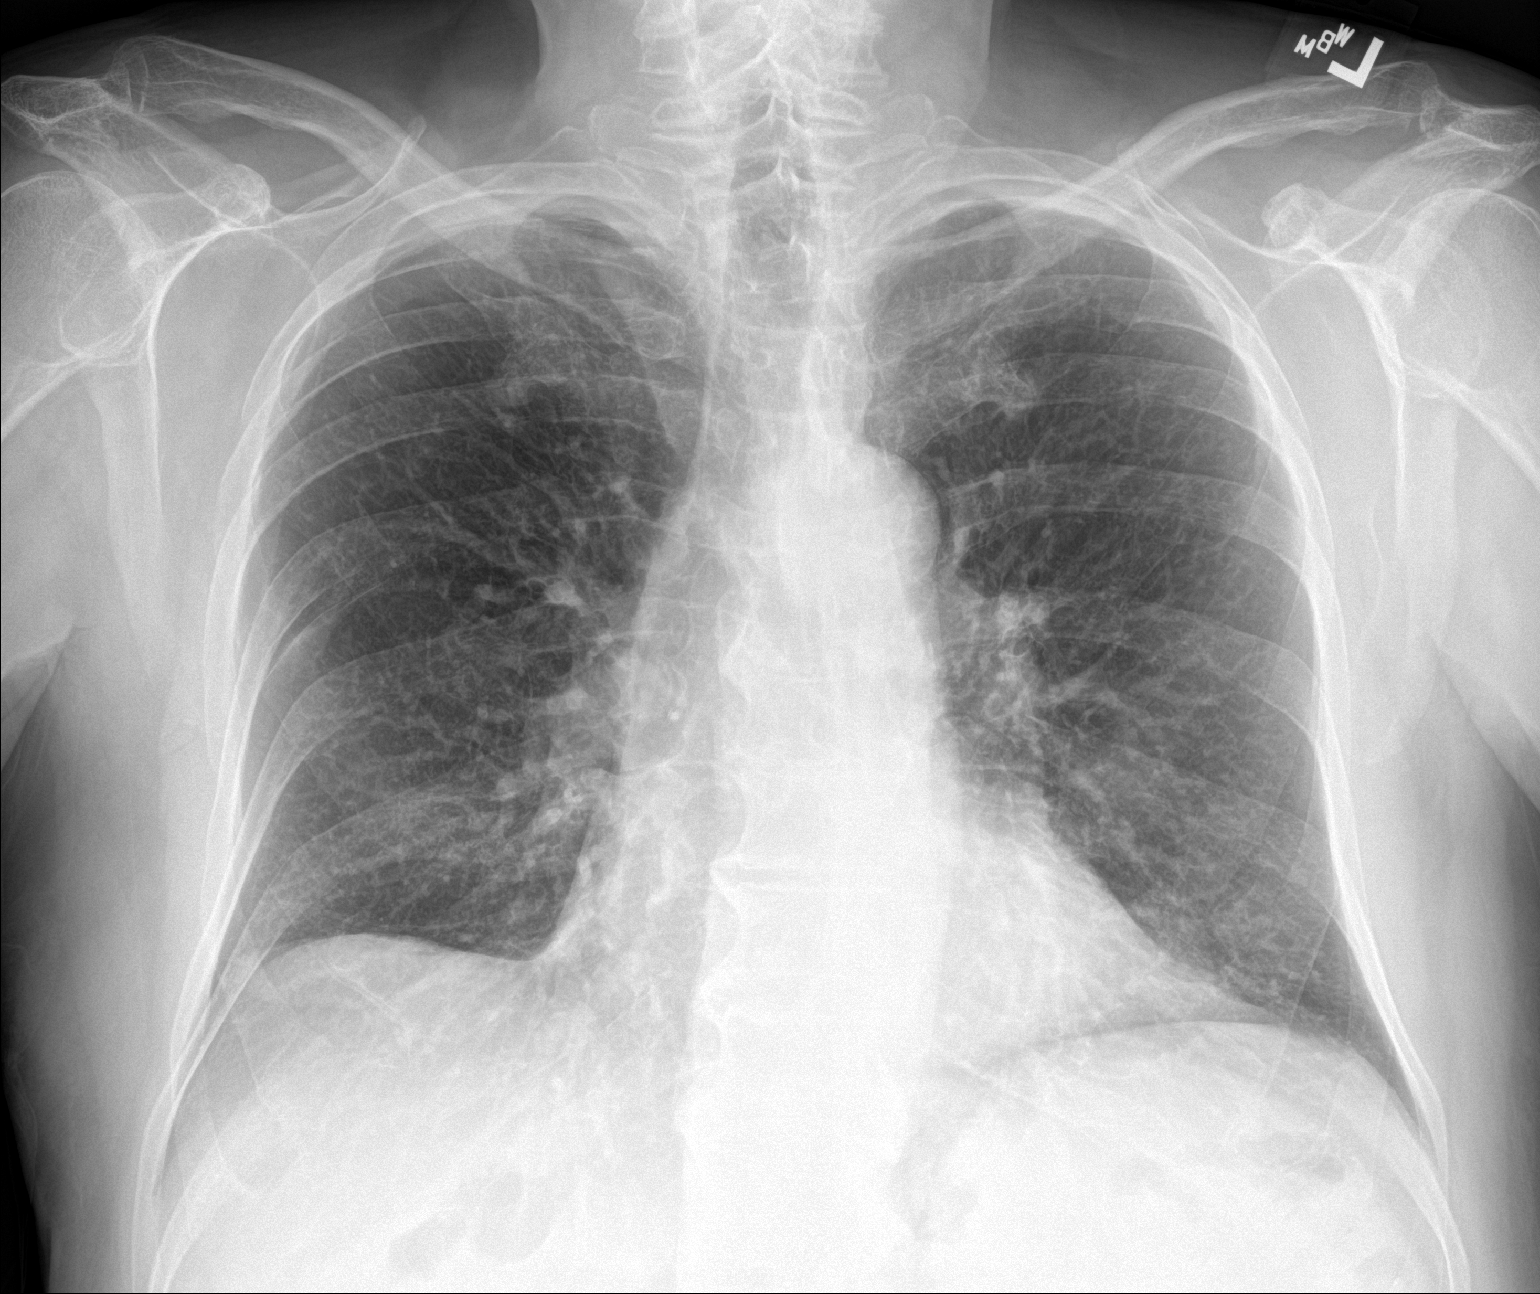

[chest lat]
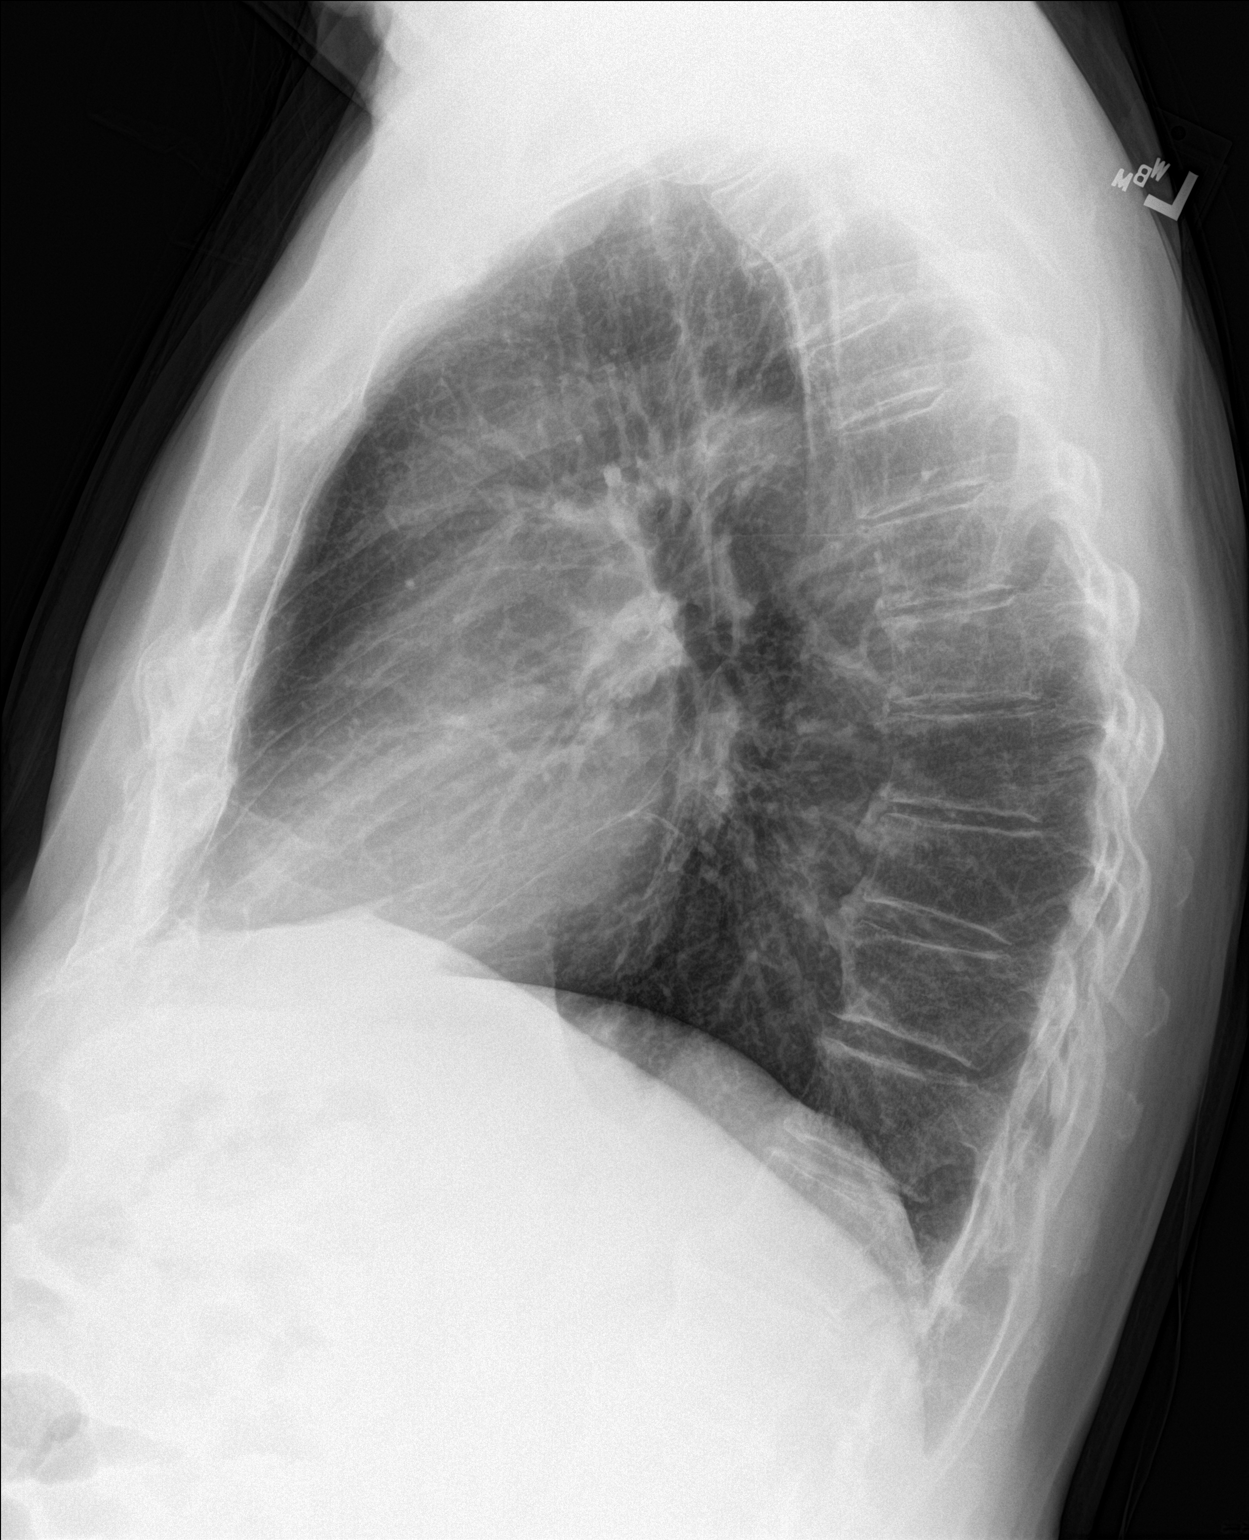

[2 of 2 positions shown; findings below may reference images not displayed]

FINDINGS: The heart is normal in size. Normal mediastinal contours. No acute
or focal airspace disease. There is mild bronchial thickening. No
pulmonary edema, pleural effusion or pneumothorax. No acute osseous
abnormalities are seen.
IMPRESSION: Mild bronchial thickening. No airspace disease or pneumonia.

## 2023-04-01 ENCOUNTER — Other Ambulatory Visit: Payer: Self-pay | Admitting: Physician Assistant

## 2023-04-01 DIAGNOSIS — K219 Gastro-esophageal reflux disease without esophagitis: Secondary | ICD-10-CM

## 2023-04-03 NOTE — Telephone Encounter (Signed)
Requested Prescriptions  Pending Prescriptions Disp Refills   omeprazole (PRILOSEC) 40 MG capsule [Pharmacy Med Name: OMEPRAZOLE DR 40 MG CAPSULE] 90 capsule 0    Sig: Take 1 capsule (40 mg total) by mouth daily.     Gastroenterology: Proton Pump Inhibitors Passed - 04/01/2023 12:06 PM      Passed - Valid encounter within last 12 months    Recent Outpatient Visits           1 month ago Essential hypertension   [place] [facility] [place], Megan P, DO   8 months ago Chronic obstructive pulmonary disease, unspecified COPD type (HCC)   [place] [facility] Mecum, Erin E, PA-C   8 months ago Seasonal allergic rhinitis, unspecified trigger   [place] [facility] Raymond, Sherran Needs, NP   9 months ago Upper respiratory tract infection, unspecified type   [place] [facility] Mecum, Oswaldo Conroy, PA-C   1 year ago Hyperlipidemia, unspecified hyperlipidemia type   [place] [facility] Mecum, Oswaldo Conroy, PA-C       Future Appointments             In 4 months Laural Benes, Oralia Rud, DO [place] [facility]

## 2023-04-19 DIAGNOSIS — J4520 Mild intermittent asthma, uncomplicated: Secondary | ICD-10-CM | POA: Diagnosis not present

## 2023-04-19 DIAGNOSIS — G4733 Obstructive sleep apnea (adult) (pediatric): Secondary | ICD-10-CM | POA: Diagnosis not present

## 2023-04-19 DIAGNOSIS — J301 Allergic rhinitis due to pollen: Secondary | ICD-10-CM | POA: Diagnosis not present

## 2023-04-28 ENCOUNTER — Other Ambulatory Visit: Payer: Self-pay | Admitting: Family Medicine

## 2023-04-28 DIAGNOSIS — J42 Unspecified chronic bronchitis: Secondary | ICD-10-CM

## 2023-04-28 NOTE — Telephone Encounter (Signed)
Requested Prescriptions  Pending Prescriptions Disp Refills   ADVAIR HFA 115-21 MCG/ACT inhaler [Pharmacy Med Name: ADVAIR HFA 115-21 MCG INHALER] 12 g 0    Sig: Inhale 2 puffs into the lungs 2 (two) times daily.     Pulmonology:  Combination Products Passed - 04/28/2023 11:40 AM      Passed - Valid encounter within last 12 months    Recent Outpatient Visits           2 months ago Essential hypertension   [place] [facility] [place], Megan P, DO   8 months ago Chronic obstructive pulmonary disease, unspecified COPD type (HCC)   [place] [facility] Mecum, Erin E, PA-C   9 months ago Seasonal allergic rhinitis, unspecified trigger   [place] [facility] [place], Sherran Needs, NP   10 months ago Upper respiratory tract infection, unspecified type   [place] [facility] Mecum, Oswaldo Conroy, PA-C   1 year ago Hyperlipidemia, unspecified hyperlipidemia type    [facility] Mecum, Oswaldo Conroy, PA-C       Future Appointments             In 3 months Laural Benes, Oralia Rud, DO  [facility], [facility]

## 2023-06-23 DIAGNOSIS — H1132 Conjunctival hemorrhage, left eye: Secondary | ICD-10-CM | POA: Diagnosis not present

## 2023-07-20 ENCOUNTER — Ambulatory Visit (INDEPENDENT_AMBULATORY_CARE_PROVIDER_SITE_OTHER): Payer: Medicare Other | Admitting: Emergency Medicine

## 2023-07-20 VITALS — Ht 69.0 in | Wt 190.0 lb

## 2023-07-20 DIAGNOSIS — Z0000 Encounter for general adult medical examination without abnormal findings: Secondary | ICD-10-CM | POA: Diagnosis not present

## 2023-07-20 NOTE — Patient Instructions (Addendum)
 Carl Fernandez , Thank you for taking time to come for your Medicare Wellness Visit. I appreciate your ongoing commitment to your health goals. Please review the following plan we discussed and let me know if I can assist you in the future.   Referrals/Orders/Follow-Ups/Clinician Recommendations: Keep up the good work!!  This is a list of the screening recommended for you and due dates:  Health Maintenance  Topic Date Due   COVID-19 Vaccine (6 - 2024-25 season) 08/06/2023   DTaP/Tdap/Td vaccine (2 - Td or Tdap) 10/10/2023   Medicare Annual Wellness Visit  07/19/2024   Pneumonia Vaccine  Completed   Flu Shot  Completed   Zoster (Shingles) Vaccine  Completed   HPV Vaccine  Aged Out    Advanced directives: (Copy Requested) Please bring a copy of your health care power of attorney and living will to the office to be added to your chart at your convenience. You can mail to [facility] [address] [place], [place] [address] or email to [email or web address]  Next Medicare Annual Wellness Visit scheduled for next year: Yes, 08/01/24 @ 11:20am (phone visit)

## 2023-07-20 NOTE — Progress Notes (Signed)
 Subjective:   Carl Fernandez is a 83 y.o. who presents for a Medicare Wellness preventive visit.  Visit Complete: Virtual I connected with  [name] on 07/20/23 by a audio enabled telemedicine application and verified that I am speaking with the correct person using two identifiers.  Patient Location: Home  Provider Location: Home Office  I discussed the limitations of evaluation and management by telemedicine. The patient expressed understanding and agreed to proceed.  Vital Signs: Because this visit was a virtual/telehealth visit, some criteria may be missing or patient reported. Any vitals not documented were not able to be obtained and vitals that have been documented are patient reported.  VideoDeclined- This patient declined Librarian, academic. Therefore the visit was completed with audio only.  Persons Participating in Visit:  Boyd Kerbs, wife and patient was present during visit.  AWV Questionnaire: No: Patient Medicare AWV questionnaire was not completed prior to this visit.  Cardiac Risk Factors include: advanced age (>49men, >26 women);male gender;hypertension;dyslipidemia;Other (see comment), Risk factor comments: OSA (cpap)     Objective:    Today's Vitals   07/20/23 1125  Weight: 190 lb (86.2 kg)  Height: 5\' 9"  (1.753 m)   Body mass index is 28.06 kg/m.     07/20/2023   11:40 AM 07/04/2022    9:51 AM 06/22/2021   10:36 AM 03/29/2021   10:45 AM 03/27/2020   10:32 AM 03/25/2019   10:28 AM 03/22/2018   10:18 AM  Advanced Directives  Does Patient Have a Medical Advance Directive? Yes No Yes No Yes Yes Yes  Type of Estate agent of [place];Living will  Living will;Healthcare Power of [employer] Power of [place];Living will Living will;Healthcare Power of [employer] Power of [place];Living will  Does patient want to make changes to medical advance directive? No - Patient declined      No -  Patient declined  Copy of Healthcare Power of Attorney in Chart? No - copy requested  No - copy requested  No - copy requested No - copy requested No - copy requested  Would patient like information on creating a medical advance directive?  No - Patient declined  No - Patient declined       Current Medications (verified) Outpatient Encounter Medications as of 07/20/2023  Medication Sig   acetaminophen (TYLENOL) 500 MG tablet Take 500 mg by mouth every 6 (six) hours as needed.   albuterol (VENTOLIN HFA) 108 (90 Base) MCG/ACT inhaler Inhale 2 puffs into the lungs every 6 (six) hours as needed for wheezing or shortness of breath.   atorvastatin (LIPITOR) 40 MG tablet Take 1 tablet (40 mg total) by mouth daily.   benazepril (LOTENSIN) 20 MG tablet Take 1 tablet (20 mg total) by mouth daily.   fluticasone-salmeterol (ADVAIR HFA) 115-21 MCG/ACT inhaler Inhale 2 puffs into the lungs 2 (two) times daily.   loratadine (CLARITIN) 10 MG tablet Take 1 tablet (10 mg total) by mouth daily as needed for allergies.   montelukast (SINGULAIR) 10 MG tablet Take 1 tablet (10 mg total) by mouth at bedtime.   omeprazole (PRILOSEC) 40 MG capsule Take 1 capsule (40 mg total) by mouth daily.   azelastine (ASTELIN) 0.1 % nasal spray Place into both nostrils. (Patient not taking: Reported on 07/20/2023)   fluticasone (FLONASE) 50 MCG/ACT nasal spray Place into the nose. (Patient not taking: Reported on 07/20/2023)   No facility-administered encounter medications on file as of 07/20/2023.    Allergies (verified) Amoxicillin  and Penicillin g   History: Past Medical History:  Diagnosis Date   COPD (chronic obstructive pulmonary disease) (HCC)    Diverticulitis    GERD (gastroesophageal reflux disease)    Hard of hearing    Kidney stones    Need for influenza vaccination 02/08/2021   OSA on CPAP    Plantar fasciitis    Past Surgical History:  Procedure Laterality Date   LITHOTRIPSY     nasoplasty     Family  History  Problem Relation Age of Onset   Hypertension Mother    Osteoporosis Mother    Cancer Mother        skin on her nose   Heart disease Father    Social History   Socioeconomic History   Marital status: Married    Spouse name: Boyd Kerbs   Number of children: 2   Years of education: Not on file   Highest education level: Not on file  Occupational History   Not on file  Tobacco Use   Smoking status: Never    Passive exposure: Past   Smokeless tobacco: Never  Vaping Use   Vaping status: Never Used  Substance and Sexual Activity   Alcohol use: No   Drug use: No   Sexual activity: Not Currently  Other Topics Concern   Not on file  Social History Narrative   Not on file   Social Drivers of Health   Financial Resource Strain: Low Risk  (07/20/2023)   Overall Financial Resource Strain (CARDIA)    Difficulty of Paying Living Expenses: Not hard at all  Food Insecurity: No Food Insecurity (07/20/2023)   Hunger Vital Sign    Worried About Running Out of Food in the Last Year: Never true    Ran Out of Food in the Last Year: Never true  Transportation Needs: No Transportation Needs (07/20/2023)   PRAPARE - Administrator, Civil Service (Medical): No    Lack of Transportation (Non-Medical): No  Physical Activity: Inactive (07/20/2023)   Exercise Vital Sign    Days of Exercise per Week: 0 days    Minutes of Exercise per Session: 0 min  Stress: No Stress Concern Present (07/20/2023)   [employer] of Occupational Health - Occupational Stress Questionnaire    Feeling of Stress : Not at all  Social Connections: Socially Integrated (07/20/2023)   Social Connection and Isolation Panel [NHANES]    Frequency of Communication with Friends and Family: More than three times a week    Frequency of Social Gatherings with Friends and Family: Three times a week    Attends Religious Services: More than 4 times per year    Active Member of Clubs or Organizations: Yes    Attends  Engineer, structural: More than 4 times per year    Marital Status: Married    Tobacco Counseling Counseling given: Not Answered    Clinical Intake:  Pre-visit preparation completed: Yes  Pain : No/denies pain     BMI - recorded: 28.06 Nutritional Status: BMI 25 -29 Overweight Nutritional Risks: None Diabetes: No  How often do you need to have someone help you when you read instructions, pamphlets, or other written materials from your doctor or pharmacy?: 1 - Never  Interpreter Needed?: No  Information entered by :: Tora Kindred, CMA   Activities of Daily Living     07/20/2023   11:29 AM  In your present state of health, do you have any difficulty performing the following activities:  Hearing? 1  Comment wears hearing aids  Vision? 0  Difficulty concentrating or making decisions? 0  Walking or climbing stairs? 0  Dressing or bathing? 0  Doing errands, shopping? 0  Preparing Food and eating ? N  Using the Toilet? N  In the past six months, have you accidently leaked urine? N  Do you have problems with loss of bowel control? N  Managing your Medications? N  Managing your Finances? N  Housekeeping or managing your Housekeeping? N    Patient Care Team: Dorcas Carrow, DO as PCP - General (Family Medicine) Mertie Moores, MD as Referring Physician (Specialist) Dasher, Cliffton Asters, MD (Dermatology) Scot Jun, MD (Inactive) (Gastroenterology)  Indicate any recent Medical Services you may have received from other than [facility] providers in the past year (date may be approximate).     Assessment:   This is a routine wellness examination for [place].  Hearing/Vision screen Hearing Screening - Comments:: Wears hearing aids Vision Screening - Comments:: Gets eye exams, Dr. Adriana Simas Amo   Goals Addressed             This Visit's Progress    Patient Stated       Lift weights       Depression Screen     07/20/2023   11:37 AM  02/06/2023    8:20 AM 08/04/2022    8:17 AM 07/04/2022    9:50 AM 06/16/2022    9:38 AM 02/03/2022    9:33 AM 08/03/2021    2:41 PM  PHQ 2/9 Scores  PHQ - 2 Score 0 0 0 0 0 0 0  PHQ- 9 Score 0 0 1 0 0 2 3    Fall Risk     07/20/2023   11:41 AM 02/06/2023    8:19 AM 08/04/2022    8:16 AM 07/04/2022    9:52 AM 06/16/2022    9:38 AM  Fall Risk   Falls in the past year? 0 0 0 0 0  Number falls in past yr: 0 0 0 0 0  Injury with Fall? 0 0 0 0 0  Risk for fall due to : No Fall Risks No Fall Risks No Fall Risks No Fall Risks No Fall Risks  Follow up Falls prevention discussed;Falls evaluation completed Falls evaluation completed Falls evaluation completed Falls prevention discussed;Falls evaluation completed Falls prevention discussed;Education provided;Falls evaluation completed    MEDICARE RISK AT HOME:  Medicare Risk at Home Any stairs in or around the home?: Yes If so, are there any without handrails?: No Home free of loose throw rugs in walkways, pet beds, electrical cords, etc?: Yes Adequate lighting in your home to reduce risk of falls?: Yes Life alert?: No Use of a cane, walker or w/c?: No Grab bars in the bathroom?: Yes Shower chair or bench in shower?: No Elevated toilet seat or a handicapped toilet?: Yes  TIMED UP AND GO:  Was the test performed?  No  Cognitive Function: 6CIT completed        07/20/2023   11:42 AM 07/04/2022    9:55 AM 03/29/2021   10:38 AM 03/27/2020   10:35 AM 03/25/2019   10:30 AM  6CIT Screen  What Year? 0 points 0 points 0 points 0 points 0 points  What month? 0 points 0 points 0 points 0 points 0 points  What time? 0 points 0 points 0 points 0 points 0 points  Count back from 20 0 points 0 points 0 points  0 points 0 points  Months in reverse 0 points 0 points 0 points 0 points 0 points  Repeat phrase 0 points 0 points 0 points 0 points 0 points  Total Score 0 points 0 points 0 points 0 points 0 points    Immunizations Immunization History   Administered Date(s) Administered   Fluad Quad(high Dose 65+) 02/03/2021, 01/27/2022   Fluad Trivalent(High Dose 65+) 02/06/2023   Influenza, High Dose Seasonal PF 01/26/2016, 01/26/2017, 03/22/2018   Influenza,inj,Quad PF,6+ Mos 04/29/2015   Influenza-Unspecified 01/07/2014, 01/10/2019, 02/12/2020, 01/27/2022   PFIZER(Purple Top)SARS-COV-2 Vaccination 05/29/2019, 06/22/2019, 02/12/2020   Pfizer(Comirnaty)Fall Seasonal Vaccine 12 years and older 02/06/2023   Pneumococcal Conjugate-13 04/29/2015   Pneumococcal Polysaccharide-23 01/26/2017   Rsv, Bivalent, Protein Subunit Rsvpref,pf (Abrysvo) 02/15/2022   Tdap 10/09/2013   Unspecified SARS-COV-2 Vaccination 03/09/2022   Zoster Recombinant(Shingrix) 06/21/2018, 10/09/2018, 01/11/2019    Screening Tests Health Maintenance  Topic Date Due   COVID-19 Vaccine (6 - 2024-25 season) 08/06/2023   DTaP/Tdap/Td (2 - Td or Tdap) 10/10/2023   Medicare Annual Wellness (AWV)  07/19/2024   Pneumonia Vaccine 27+ Years old  Completed   INFLUENZA VACCINE  Completed   Zoster Vaccines- Shingrix  Completed   HPV VACCINES  Aged Out    Health Maintenance  There are no preventive care reminders to display for this patient. Health Maintenance Items Addressed: See Nurse Notes  Additional Screening:  Vision Screening: Recommended annual ophthalmology exams for early detection of glaucoma and other disorders of the eye.  Dental Screening: Recommended annual dental exams for proper oral hygiene  Community Resource Referral / Chronic Care Management: CRR required this visit?  No   CCM required this visit?  No     Plan:     I have personally reviewed and noted the following in the patient's chart:   Medical and social history Use of alcohol, tobacco or illicit drugs  Current medications and supplements including opioid prescriptions. Patient is not currently taking opioid prescriptions. Functional ability and status Nutritional  status Physical activity Advanced directives List of other physicians Hospitalizations, surgeries, and ER visits in previous 12 months Vitals Screenings to include cognitive, depression, and falls Referrals and appointments  In addition, I have reviewed and discussed with patient certain preventive protocols, quality metrics, and best practice recommendations. A written personalized care plan for preventive services as well as general preventive health recommendations were provided to patient.     Tora Kindred, CMA   07/20/2023   After Visit Summary: (MyChart) Due to this being a telephonic visit, the after visit summary with patients personalized plan was offered to patient via MyChart   Notes: Nothing significant to report at this time.

## 2023-07-30 ENCOUNTER — Other Ambulatory Visit: Payer: Self-pay | Admitting: Family Medicine

## 2023-07-30 DIAGNOSIS — I10 Essential (primary) hypertension: Secondary | ICD-10-CM

## 2023-07-30 DIAGNOSIS — E785 Hyperlipidemia, unspecified: Secondary | ICD-10-CM

## 2023-08-01 NOTE — Telephone Encounter (Signed)
 Requested Prescriptions  Pending Prescriptions Disp Refills   atorvastatin (LIPITOR) 40 MG tablet [Pharmacy Med Name: ATORVASTATIN 40 MG TABLET] 90 tablet 0    Sig: Take 1 tablet (40 mg total) by mouth daily.     Cardiovascular:  Antilipid - Statins Failed - 08/01/2023  8:37 AM      Failed - Lipid Panel in normal range within the last 12 months    Cholesterol, Total  Date Value Ref Range Status  02/06/2023 101 100 - 199 mg/dL Final   LDL Chol Calc (NIH)  Date Value Ref Range Status  02/06/2023 47 0 - 99 mg/dL Final   HDL  Date Value Ref Range Status  02/06/2023 38 (L) >39 mg/dL Final   Triglycerides  Date Value Ref Range Status  02/06/2023 80 0 - 149 mg/dL Final         Passed - Patient is not pregnant      Passed - Valid encounter within last 12 months    Recent Outpatient Visits           5 months ago Essential hypertension   [place] [facility] Simpson, Megan P, DO   12 months ago Chronic obstructive pulmonary disease, unspecified COPD type (HCC)   [place] [facility] Mecum, Oswaldo Conroy, PA-C   1 year ago Seasonal allergic rhinitis, unspecified trigger   [place] [facility] Pearley, Sherran Needs, NP   1 year ago Upper respiratory tract infection, unspecified type   [place] [facility] Mecum, Erin E, PA-C   1 year ago Hyperlipidemia, unspecified hyperlipidemia type   [place] [facility] Mecum, Oswaldo Conroy, PA-C       Future Appointments             In 6 days Johnson, Oralia Rud, DO [place] [facility]             benazepril (LOTENSIN) 20 MG tablet [Pharmacy Med Name: BENAZEPRIL HCL 20 MG TABLET] 90 tablet 0    Sig: Take 1 tablet (20 mg total) by mouth daily.     Cardiovascular:  ACE Inhibitors Passed - 08/01/2023  8:37 AM      Passed - Cr in normal range and within 180 days    Creatinine, Ser  Date Value Ref Range Status  02/06/2023 1.17 0.76 - 1.27 mg/dL  Final         Passed - K in normal range and within 180 days    Potassium  Date Value Ref Range Status  02/06/2023 4.1 3.5 - 5.2 mmol/L Final         Passed - Patient is not pregnant      Passed - Last BP in normal range    BP Readings from Last 1 Encounters:  02/06/23 134/73         Passed - Valid encounter within last 6 months    Recent Outpatient Visits           5 months ago Essential hypertension   [place] [facility] [place], Megan P, DO   12 months ago Chronic obstructive pulmonary disease, unspecified COPD type (HCC)   [place] [facility] Mecum, Erin E, PA-C   1 year ago Seasonal allergic rhinitis, unspecified trigger   [place] [facility] Pearley, Sherran Needs, NP   1 year ago Upper respiratory tract infection, unspecified type    [facility] Mecum, Oswaldo Conroy, PA-C  1 year ago Hyperlipidemia, unspecified hyperlipidemia type   [place] [facility] Mecum, Oswaldo Conroy, PA-C       Future Appointments             In 6 days Laural Benes, Oralia Rud, DO [place] [facility], [facility]

## 2023-08-02 ENCOUNTER — Other Ambulatory Visit: Payer: Self-pay | Admitting: Family Medicine

## 2023-08-02 DIAGNOSIS — K219 Gastro-esophageal reflux disease without esophagitis: Secondary | ICD-10-CM

## 2023-08-03 NOTE — Telephone Encounter (Signed)
 Requested Prescriptions  Pending Prescriptions Disp Refills   omeprazole (PRILOSEC) 40 MG capsule [Pharmacy Med Name: OMEPRAZOLE DR 40 MG CAPSULE] 90 capsule 0    Sig: Take 1 capsule (40 mg total) by mouth daily.     Gastroenterology: Proton Pump Inhibitors Passed - 08/03/2023 12:11 PM      Passed - Valid encounter within last 12 months    Recent Outpatient Visits   None     Future Appointments             In 4 days Laural Benes, Oralia Rud, DO [place] [facility], [facility]

## 2023-08-07 ENCOUNTER — Ambulatory Visit (INDEPENDENT_AMBULATORY_CARE_PROVIDER_SITE_OTHER): Payer: Medicare Other | Admitting: Family Medicine

## 2023-08-07 ENCOUNTER — Encounter: Payer: Self-pay | Admitting: Family Medicine

## 2023-08-07 VITALS — BP 114/64 | HR 65 | Temp 98.2°F | Ht 69.0 in | Wt 181.2 lb

## 2023-08-07 DIAGNOSIS — I10 Essential (primary) hypertension: Secondary | ICD-10-CM

## 2023-08-07 DIAGNOSIS — K219 Gastro-esophageal reflux disease without esophagitis: Secondary | ICD-10-CM | POA: Diagnosis not present

## 2023-08-07 DIAGNOSIS — J42 Unspecified chronic bronchitis: Secondary | ICD-10-CM | POA: Diagnosis not present

## 2023-08-07 DIAGNOSIS — J302 Other seasonal allergic rhinitis: Secondary | ICD-10-CM | POA: Diagnosis not present

## 2023-08-07 DIAGNOSIS — E785 Hyperlipidemia, unspecified: Secondary | ICD-10-CM

## 2023-08-07 LAB — MICROALBUMIN, URINE WAIVED
Creatinine, Urine Waived: 300 mg/dL (ref 10–300)
Microalb, Ur Waived: 80 mg/L — ABNORMAL HIGH (ref 0–19)
Microalb/Creat Ratio: <30 mg/g (ref <30)

## 2023-08-07 MED ORDER — OMEPRAZOLE 40 MG PO CPDR: 40.0000 mg | DELAYED_RELEASE_CAPSULE | Freq: Every day | ORAL | 0 refills | Status: DC

## 2023-08-07 MED ORDER — ATORVASTATIN CALCIUM 40 MG PO TABS: 40.0000 mg | ORAL_TABLET | Freq: Every day | ORAL | 0 refills | Status: DC

## 2023-08-07 MED ORDER — BENAZEPRIL HCL 20 MG PO TABS: 20.0000 mg | ORAL_TABLET | Freq: Every day | ORAL | 0 refills | Status: DC

## 2023-08-07 MED ORDER — ALBUTEROL SULFATE HFA 108 (90 BASE) MCG/ACT IN AERS
2.0000 | INHALATION_SPRAY | Freq: Four times a day (QID) | RESPIRATORY_TRACT | 0 refills | Status: DC | PRN
Start: 2023-08-07 — End: 2024-02-07

## 2023-08-07 MED ORDER — LORATADINE 10 MG PO TABS
10.0000 mg | ORAL_TABLET | Freq: Every day | ORAL | 0 refills | Status: DC | PRN
Start: 2023-08-07 — End: 2023-09-05

## 2023-08-07 MED ORDER — MONTELUKAST SODIUM 10 MG PO TABS: 10.0000 mg | ORAL_TABLET | Freq: Every day | ORAL | 2 refills | Status: DC

## 2023-08-07 NOTE — Assessment & Plan Note (Signed)
 Under good control on current regimen. Continue current regimen. Continue to monitor. Call with any concerns. Refills given.

## 2023-08-07 NOTE — Assessment & Plan Note (Signed)
 Under good control on current regimen. Continue current regimen. Continue to monitor. Call with any concerns. Refills given. Labs drawn today.

## 2023-08-07 NOTE — Progress Notes (Signed)
 BP 114/64 (BP Location: Left Arm, Cuff Size: Normal)   Pulse 65   Temp 98.2 F (36.8 C) (Oral)   Ht 5\' 9"  (1.753 m)   Wt 181 lb 3.2 oz (82.2 kg)   SpO2 96%   BMI 26.76 kg/m    Subjective:    Patient ID: Carl Fernandez, male    DOB: [birth year], 83 y.o.   MRN: [id number]  HPI: Carl Fernandez is a 83 y.o. male  Chief Complaint  Patient presents with   COPD   Gastroesophageal Reflux   Hyperlipidemia   Hypertension   Has had a couple of times where his L hand will be unable to be move, gets really stiff- for several months every few weeks for a couple of seconds,   GERD- has some acid in his mouth occasionally GERD control status: uncontrolled Satisfied with current treatment? yes Medication side effects: no  Medication compliance: excellent Previous GERD medications: omeprazole Dysphagia: no Odynophagia:  no Hematemesis: no Blood in stool: no EGD: no  HYPERTENSION / HYPERLIPIDEMIA Satisfied with current treatment? yes Duration of hypertension: chronic BP monitoring frequency: rarely BP medication side effects: no Past BP meds: benazepril Duration of hyperlipidemia: chronic Cholesterol medication side effects: no Cholesterol supplements: none Past cholesterol medications: atorvastatin Medication compliance: excellent compliance Aspirin: no Recent stressors: no Recurrent headaches: no Visual changes: no Palpitations: no Dyspnea: no Chest pain: no Lower extremity edema: no Dizzy/lightheaded: no  COPD COPD status: controlled Satisfied with current treatment?: yes Oxygen use: no Dyspnea frequency: rarely  Cough frequency: rarely Rescue inhaler frequency: with illness   Limitation of activity: no Productive cough: no Pneumovax: Up to Date Influenza: Up to Date    Relevant past medical, surgical, family and social history reviewed and updated as indicated. Interim medical history since our last visit reviewed. Allergies and medications reviewed and  updated.  Review of Systems  Constitutional: Negative.   Respiratory: Negative.    Cardiovascular: Negative.   Gastrointestinal: Negative.   Musculoskeletal: Negative.   Neurological: Negative.   Psychiatric/Behavioral: Negative.      Per HPI unless specifically indicated above     Objective:    BP 114/64 (BP Location: Left Arm, Cuff Size: Normal)   Pulse 65   Temp 98.2 F (36.8 C) (Oral)   Ht 5\' 9"  (1.753 m)   Wt 181 lb 3.2 oz (82.2 kg)   SpO2 96%   BMI 26.76 kg/m   Wt Readings from Last 3 Encounters:  08/07/23 181 lb 3.2 oz (82.2 kg)  07/20/23 190 lb (86.2 kg)  02/06/23 184 lb 9.6 oz (83.7 kg)    Physical Exam Vitals and nursing note reviewed.  Constitutional:      General: He is not in acute distress.    Appearance: Normal appearance. He is not ill-appearing, toxic-appearing or diaphoretic.  HENT:     Head: Normocephalic and atraumatic.     Right Ear: External ear normal.     Left Ear: External ear normal.     Nose: Nose normal.     Mouth/Throat:     Mouth: Mucous membranes are moist.     Pharynx: Oropharynx is clear.  Eyes:     General: No scleral icterus.       Right eye: No discharge.        Left eye: No discharge.     Extraocular Movements: Extraocular movements intact.     Conjunctiva/sclera: Conjunctivae normal.     Pupils: Pupils are equal, round, and reactive to light.  Cardiovascular:     Rate and Rhythm: Normal rate and regular rhythm.     Pulses: Normal pulses.     Heart sounds: Normal heart sounds. No murmur heard.    No friction rub. No gallop.  Pulmonary:     Effort: Pulmonary effort is normal. No respiratory distress.     Breath sounds: Normal breath sounds. No stridor. No wheezing, rhonchi or rales.  Chest:     Chest wall: No tenderness.  Musculoskeletal:        General: Normal range of motion.     Cervical back: Normal range of motion and neck supple.  Skin:    General: Skin is warm and dry.     Capillary Refill: Capillary refill  takes less than 2 seconds.     Coloration: Skin is not jaundiced or pale.     Findings: No bruising, erythema, lesion or rash.  Neurological:     General: No focal deficit present.     Mental Status: He is alert and oriented to person, place, and time. Mental status is at baseline.  Psychiatric:        Mood and Affect: Mood normal.        Behavior: Behavior normal.        Thought Content: Thought content normal.        Judgment: Judgment normal.     Results for orders placed or performed in visit on 02/06/23  CBC with Differential/Platelet   Collection Time: 02/06/23  8:52 AM  Result Value Ref Range   WBC 6.6 3.4 - 10.8 x10E3/uL   RBC 4.55 4.14 - 5.80 x10E6/uL   Hemoglobin 13.3 13.0 - 17.7 g/dL   Hematocrit 54.0 98.1 - 51.0 %   MCV 92 79 - 97 fL   MCH 29.2 26.6 - 33.0 pg   MCHC 31.7 31.5 - 35.7 g/dL   RDW 19.1 47.8 - 29.5 %   Platelets 175 150 - 450 x10E3/uL   Neutrophils 74 Not Estab. %   Lymphs 17 Not Estab. %   Monocytes 7 Not Estab. %   Eos 2 Not Estab. %   Basos 0 Not Estab. %   Neutrophils Absolute 4.9 1.4 - 7.0 x10E3/uL   Lymphocytes Absolute 1.1 0.7 - 3.1 x10E3/uL   Monocytes Absolute 0.5 0.1 - 0.9 x10E3/uL   EOS (ABSOLUTE) 0.2 0.0 - 0.4 x10E3/uL   Basophils Absolute 0.0 0.0 - 0.2 x10E3/uL   Immature Granulocytes 0 Not Estab. %   Immature Grans (Abs) 0.0 0.0 - 0.1 x10E3/uL  Lipid Panel w/o Chol/HDL Ratio   Collection Time: 02/06/23  8:52 AM  Result Value Ref Range   Cholesterol, Total 101 100 - 199 mg/dL   Triglycerides 80 0 - 149 mg/dL   HDL 38 (L) >62 mg/dL   VLDL Cholesterol Cal 16 5 - 40 mg/dL   LDL Chol Calc (NIH) 47 0 - 99 mg/dL  Comprehensive metabolic panel   Collection Time: 02/06/23  8:52 AM  Result Value Ref Range   Glucose 89 70 - 99 mg/dL   BUN 13 8 - 27 mg/dL   Creatinine, Ser 1.30 0.76 - 1.27 mg/dL   eGFR 62 >86 VH/QIO/9.62   BUN/Creatinine Ratio 11 10 - 24   Sodium 146 (H) 134 - 144 mmol/L   Potassium 4.1 3.5 - 5.2 mmol/L   Chloride 110  (H) 96 - 106 mmol/L   CO2 25 20 - 29 mmol/L   Calcium 8.8 8.6 - 10.2 mg/dL   Total Protein  6.4 6.0 - 8.5 g/dL   Albumin 4.1 3.7 - 4.7 g/dL   Globulin, Total 2.3 1.5 - 4.5 g/dL   Bilirubin Total 0.8 0.0 - 1.2 mg/dL   Alkaline Phosphatase 89 44 - 121 IU/L   AST 14 0 - 40 IU/L   ALT 9 0 - 44 IU/L      Assessment & Plan:   Problem List Items Addressed This Visit       Cardiovascular and Mediastinum   Essential hypertension - Primary   Under good control on current regimen. Continue current regimen. Continue to monitor. Call with any concerns. Refills given. Labs drawn today.      Relevant Medications   atorvastatin (LIPITOR) 40 MG tablet   benazepril (LOTENSIN) 20 MG tablet   Other Relevant Orders   Comprehensive metabolic panel with GFR   TSH   Microalbumin, Urine Waived     Respiratory   COPD (chronic obstructive pulmonary disease) (HCC)   Under good control on current regimen. Continue current regimen. Continue to monitor. Call with any concerns. Refills given.        Relevant Medications   montelukast (SINGULAIR) 10 MG tablet   albuterol (VENTOLIN HFA) 108 (90 Base) MCG/ACT inhaler   loratadine (CLARITIN) 10 MG tablet   Allergic rhinitis   Under good control on current regimen. Continue current regimen. Continue to monitor. Call with any concerns. Refills given.       Relevant Medications   loratadine (CLARITIN) 10 MG tablet     Digestive   GERD (gastroesophageal reflux disease)   Under good control on current regimen. Continue current regimen. Continue to monitor. Call with any concerns. Refills given. Labs drawn today.      Relevant Medications   omeprazole (PRILOSEC) 40 MG capsule   Other Relevant Orders   Comprehensive metabolic panel with GFR   CBC with Differential/Platelet     Other   Hyperlipidemia   Under good control on current regimen. Continue current regimen. Continue to monitor. Call with any concerns. Refills given. Labs drawn today.       Relevant Medications   atorvastatin (LIPITOR) 40 MG tablet   benazepril (LOTENSIN) 20 MG tablet   Other Relevant Orders   Comprehensive metabolic panel with GFR   Lipid Panel w/o Chol/HDL Ratio     Follow up plan: Return in about 6 months (around 02/06/2024).

## 2023-08-08 ENCOUNTER — Encounter: Payer: Self-pay | Admitting: Family Medicine

## 2023-08-08 LAB — COMPREHENSIVE METABOLIC PANEL WITH GFR
ALT: 10 IU/L (ref 0–44)
AST: 16 IU/L (ref 0–40)
Albumin: 4.1 g/dL (ref 3.7–4.7)
Alkaline Phosphatase: 90 IU/L (ref 44–121)
BUN/Creatinine Ratio: 10 (ref 10–24)
BUN: 12 mg/dL (ref 8–27)
Bilirubin Total: 0.5 mg/dL (ref 0.0–1.2)
CO2: 24 mmol/L (ref 20–29)
Calcium: 8.8 mg/dL (ref 8.6–10.2)
Chloride: 111 mmol/L — ABNORMAL HIGH (ref 96–106)
Creatinine, Ser: 1.18 mg/dL (ref 0.76–1.27)
Globulin, Total: 2 g/dL (ref 1.5–4.5)
Glucose: 89 mg/dL (ref 70–99)
Potassium: 4.4 mmol/L (ref 3.5–5.2)
Sodium: 147 mmol/L — ABNORMAL HIGH (ref 134–144)
Total Protein: 6.1 g/dL (ref 6.0–8.5)
eGFR: 61 mL/min/{1.73_m2} (ref >59)

## 2023-08-08 LAB — CBC WITH DIFFERENTIAL/PLATELET
Basophils Absolute: 0 10*3/uL (ref 0.0–0.2)
Basos: 1 %
EOS (ABSOLUTE): 0.2 10*3/uL (ref 0.0–0.4)
Eos: 3 %
Hematocrit: 39.1 % (ref 37.5–51.0)
Hemoglobin: 12.6 g/dL — ABNORMAL LOW (ref 13.0–17.7)
Immature Grans (Abs): 0 10*3/uL (ref 0.0–0.1)
Immature Granulocytes: 0 %
Lymphocytes Absolute: 1.1 10*3/uL (ref 0.7–3.1)
Lymphs: 19 %
MCH: 29 pg (ref 26.6–33.0)
MCHC: 32.2 g/dL (ref 31.5–35.7)
MCV: 90 fL (ref 79–97)
Monocytes Absolute: 0.5 10*3/uL (ref 0.1–0.9)
Monocytes: 8 %
Neutrophils Absolute: 4.1 10*3/uL (ref 1.4–7.0)
Neutrophils: 69 %
Platelets: 191 10*3/uL (ref 150–450)
RBC: 4.34 x10E6/uL (ref 4.14–5.80)
RDW: 12.3 % (ref 11.6–15.4)
WBC: 5.9 10*3/uL (ref 3.4–10.8)

## 2023-08-08 LAB — LIPID PANEL W/O CHOL/HDL RATIO
Cholesterol, Total: 91 mg/dL — ABNORMAL LOW (ref 100–199)
HDL: 35 mg/dL — ABNORMAL LOW (ref >39)
LDL Chol Calc (NIH): 42 mg/dL (ref 0–99)
Triglycerides: 58 mg/dL (ref 0–149)
VLDL Cholesterol Cal: 14 mg/dL (ref 5–40)

## 2023-08-08 LAB — TSH: TSH: 1.73 u[IU]/mL (ref 0.450–4.500)

## 2023-08-14 ENCOUNTER — Other Ambulatory Visit: Payer: Self-pay | Admitting: Family Medicine

## 2023-08-14 DIAGNOSIS — J42 Unspecified chronic bronchitis: Secondary | ICD-10-CM

## 2023-08-15 NOTE — Telephone Encounter (Signed)
 Requested Prescriptions  Pending Prescriptions Disp Refills   fluticasone-salmeterol (ADVAIR HFA) 115-21 MCG/ACT inhaler [Pharmacy Med Name: FLUTICASONE-SALMETEROL 115-21] 12 g 0    Sig: Inhale 2 puffs into the lungs 2 (two) times daily.     Pulmonology:  Combination Products Passed - 08/15/2023  9:39 AM      Passed - Valid encounter within last 12 months    Recent Outpatient Visits           1 week ago Essential hypertension   [place] [facility] [place], [place], DO

## 2023-09-03 ENCOUNTER — Other Ambulatory Visit: Payer: Self-pay | Admitting: Family Medicine

## 2023-09-03 DIAGNOSIS — J302 Other seasonal allergic rhinitis: Secondary | ICD-10-CM

## 2023-09-05 NOTE — Telephone Encounter (Signed)
 Requested Prescriptions  Pending Prescriptions Disp Refills   ALLERGY RELIEF 10 MG tablet [Pharmacy Med Name: LORATADINE  10 MG TABLET] 30 tablet 0    Sig: Take 1 tablet (10 mg total) by mouth daily as needed for allergies.     Ear, Nose, and Throat:  Antihistamines 2 Passed - 09/05/2023 12:32 PM      Passed - Cr in normal range and within 360 days    Creatinine, Ser  Date Value Ref Range Status  08/07/2023 1.18 0.76 - 1.27 mg/dL Final         Passed - Valid encounter within last 12 months    Recent Outpatient Visits           4 weeks ago Essential hypertension   [facility] [place], [place], DO

## 2023-09-06 ENCOUNTER — Other Ambulatory Visit: Payer: Self-pay | Admitting: Family Medicine

## 2023-09-06 DIAGNOSIS — J302 Other seasonal allergic rhinitis: Secondary | ICD-10-CM

## 2023-09-09 NOTE — Telephone Encounter (Signed)
 Duplicate request, refilled 09/05/23 for 30 days.  Requested Prescriptions  Pending Prescriptions Disp Refills   ALLERGY RELIEF 10 MG tablet [Pharmacy Med Name: LORATADINE  10 MG TABLET] 30 tablet 0    Sig: Take 1 tablet (10 mg total) by mouth daily as needed for allergies.     Ear, Nose, and Throat:  Antihistamines 2 Passed - 09/09/2023  9:20 AM      Passed - Cr in normal range and within 360 days    Creatinine, Ser  Date Value Ref Range Status  08/07/2023 1.18 0.76 - 1.27 mg/dL Final         Passed - Valid encounter within last 12 months    Recent Outpatient Visits           1 month ago Essential hypertension   [place] [facility] [place], [place], DO

## 2023-09-21 DIAGNOSIS — G4733 Obstructive sleep apnea (adult) (pediatric): Secondary | ICD-10-CM | POA: Diagnosis not present

## 2023-09-21 DIAGNOSIS — J4520 Mild intermittent asthma, uncomplicated: Secondary | ICD-10-CM | POA: Diagnosis not present

## 2023-09-21 DIAGNOSIS — J301 Allergic rhinitis due to pollen: Secondary | ICD-10-CM | POA: Diagnosis not present

## 2023-10-28 ENCOUNTER — Other Ambulatory Visit: Payer: Self-pay | Admitting: Family Medicine

## 2023-10-28 DIAGNOSIS — E785 Hyperlipidemia, unspecified: Secondary | ICD-10-CM

## 2023-10-28 DIAGNOSIS — I10 Essential (primary) hypertension: Secondary | ICD-10-CM

## 2023-10-30 ENCOUNTER — Other Ambulatory Visit: Payer: Self-pay | Admitting: Family Medicine

## 2023-10-30 DIAGNOSIS — K219 Gastro-esophageal reflux disease without esophagitis: Secondary | ICD-10-CM

## 2023-10-31 NOTE — Telephone Encounter (Signed)
 Requested Prescriptions  Pending Prescriptions Disp Refills   omeprazole  (PRILOSEC) 40 MG capsule [Pharmacy Med Name: OMEPRAZOLE  DR 40 MG CAPSULE] 90 capsule 2    Sig: Take 1 capsule (40 mg total) by mouth daily.     Gastroenterology: Proton Pump Inhibitors Passed - 10/31/2023  3:27 PM      Passed - Valid encounter within last 12 months    Recent Outpatient Visits           2 months ago Essential hypertension   [place] [facility] [place], [place], DO

## 2023-10-31 NOTE — Telephone Encounter (Signed)
 Requested Prescriptions  Pending Prescriptions Disp Refills   atorvastatin  (LIPITOR) 40 MG tablet [Pharmacy Med Name: ATORVASTATIN  40 MG TABLET] 90 tablet 1    Sig: Take 1 tablet (40 mg total) by mouth daily.     Cardiovascular:  Antilipid - Statins Failed - 10/31/2023 12:19 PM      Failed - Lipid Panel in normal range within the last 12 months    Cholesterol, Total  Date Value Ref Range Status  08/07/2023 91 (L) 100 - 199 mg/dL Final   LDL Chol Calc (NIH)  Date Value Ref Range Status  08/07/2023 42 0 - 99 mg/dL Final   HDL  Date Value Ref Range Status  08/07/2023 35 (L) >39 mg/dL Final   Triglycerides  Date Value Ref Range Status  08/07/2023 58 0 - 149 mg/dL Final         Passed - Patient is not pregnant      Passed - Valid encounter within last 12 months    Recent Outpatient Visits           2 months ago Essential hypertension   [place] [facility] [place], Megan P, DO               benazepril  (LOTENSIN ) 20 MG tablet [Pharmacy Med Name: BENAZEPRIL  HCL 20 MG TABLET] 90 tablet 0    Sig: Take 1 tablet (20 mg total) by mouth daily.     Cardiovascular:  ACE Inhibitors Passed - 10/31/2023 12:19 PM      Passed - Cr in normal range and within 180 days    Creatinine, Ser  Date Value Ref Range Status  08/07/2023 1.18 0.76 - 1.27 mg/dL Final         Passed - K in normal range and within 180 days    Potassium  Date Value Ref Range Status  08/07/2023 4.4 3.5 - 5.2 mmol/L Final         Passed - Patient is not pregnant      Passed - Last BP in normal range    BP Readings from Last 1 Encounters:  08/07/23 114/64         Passed - Valid encounter within last 6 months    Recent Outpatient Visits           2 months ago Essential hypertension   [place] [facility] [place], [place], DO

## 2023-11-20 DIAGNOSIS — D2272 Melanocytic nevi of left lower limb, including hip: Secondary | ICD-10-CM | POA: Diagnosis not present

## 2023-11-20 DIAGNOSIS — D2261 Melanocytic nevi of right upper limb, including shoulder: Secondary | ICD-10-CM | POA: Diagnosis not present

## 2023-11-20 DIAGNOSIS — D225 Melanocytic nevi of trunk: Secondary | ICD-10-CM | POA: Diagnosis not present

## 2023-11-20 DIAGNOSIS — D2271 Melanocytic nevi of right lower limb, including hip: Secondary | ICD-10-CM | POA: Diagnosis not present

## 2023-11-20 DIAGNOSIS — Z85828 Personal history of other malignant neoplasm of skin: Secondary | ICD-10-CM | POA: Diagnosis not present

## 2023-11-20 DIAGNOSIS — L570 Actinic keratosis: Secondary | ICD-10-CM | POA: Diagnosis not present

## 2023-11-20 DIAGNOSIS — D2262 Melanocytic nevi of left upper limb, including shoulder: Secondary | ICD-10-CM | POA: Diagnosis not present

## 2023-12-14 ENCOUNTER — Other Ambulatory Visit: Payer: Self-pay | Admitting: Family Medicine

## 2023-12-14 DIAGNOSIS — J42 Unspecified chronic bronchitis: Secondary | ICD-10-CM

## 2023-12-16 NOTE — Telephone Encounter (Signed)
 Requested Prescriptions  Pending Prescriptions Disp Refills   montelukast  (SINGULAIR ) 10 MG tablet [Pharmacy Med Name: MONTELUKAST  SOD 10 MG TABLET] 90 tablet 1    Sig: Take 1 tablet (10 mg total) by mouth at bedtime.     Pulmonology:  Leukotriene Inhibitors Passed - 12/16/2023  9:53 PM      Passed - Valid encounter within last 12 months    Recent Outpatient Visits           4 months ago Essential hypertension   [place] [facility] [place], [place], DO

## 2023-12-23 ENCOUNTER — Other Ambulatory Visit: Payer: Self-pay | Admitting: Family Medicine

## 2023-12-23 DIAGNOSIS — J302 Other seasonal allergic rhinitis: Secondary | ICD-10-CM

## 2023-12-26 NOTE — Telephone Encounter (Signed)
 Requested Prescriptions  Pending Prescriptions Disp Refills   ALLERGY RELIEF 10 MG tablet [Pharmacy Med Name: LORATADINE  10 MG TABLET] 30 tablet 0    Sig: Take 1 tablet (10 mg total) by mouth daily as needed for allergies.     Ear, Nose, and Throat:  Antihistamines 2 Passed - 12/26/2023  2:22 PM      Passed - Cr in normal range and within 360 days    Creatinine, Ser  Date Value Ref Range Status  08/07/2023 1.18 0.76 - 1.27 mg/dL Final         Passed - Valid encounter within last 12 months    Recent Outpatient Visits           4 months ago Essential hypertension   [place] [facility] [place], [place], DO

## 2024-01-28 ENCOUNTER — Other Ambulatory Visit: Payer: Self-pay | Admitting: Family Medicine

## 2024-01-28 DIAGNOSIS — J42 Unspecified chronic bronchitis: Secondary | ICD-10-CM

## 2024-01-29 NOTE — Telephone Encounter (Signed)
 Requested Prescriptions  Pending Prescriptions Disp Refills   fluticasone -salmeterol (ADVAIR  HFA) 115-21 MCG/ACT inhaler [Pharmacy Med Name: FLUTICASONE -SALMETEROL 115-21] 12 g 2    Sig: Inhale 2 puffs into the lungs 2 (two) times daily.     Pulmonology:  Combination Products Passed - 01/29/2024  5:12 PM      Passed - Valid encounter within last 12 months    Recent Outpatient Visits           5 months ago Essential hypertension   [place] [facility] [place], [place], DO

## 2024-01-31 DIAGNOSIS — H43813 Vitreous degeneration, bilateral: Secondary | ICD-10-CM | POA: Diagnosis not present

## 2024-01-31 DIAGNOSIS — H353131 Nonexudative age-related macular degeneration, bilateral, early dry stage: Secondary | ICD-10-CM | POA: Diagnosis not present

## 2024-01-31 DIAGNOSIS — H40003 Preglaucoma, unspecified, bilateral: Secondary | ICD-10-CM | POA: Diagnosis not present

## 2024-01-31 DIAGNOSIS — H2513 Age-related nuclear cataract, bilateral: Secondary | ICD-10-CM | POA: Diagnosis not present

## 2024-02-07 ENCOUNTER — Encounter: Payer: Self-pay | Admitting: Family Medicine

## 2024-02-07 ENCOUNTER — Ambulatory Visit (INDEPENDENT_AMBULATORY_CARE_PROVIDER_SITE_OTHER): Admitting: Family Medicine

## 2024-02-07 VITALS — BP 127/74 | HR 74 | Temp 97.9°F | Ht 69.0 in | Wt 176.4 lb

## 2024-02-07 DIAGNOSIS — J42 Unspecified chronic bronchitis: Secondary | ICD-10-CM | POA: Diagnosis not present

## 2024-02-07 DIAGNOSIS — R634 Abnormal weight loss: Secondary | ICD-10-CM

## 2024-02-07 DIAGNOSIS — E785 Hyperlipidemia, unspecified: Secondary | ICD-10-CM | POA: Diagnosis not present

## 2024-02-07 DIAGNOSIS — S41101A Unspecified open wound of right upper arm, initial encounter: Secondary | ICD-10-CM

## 2024-02-07 DIAGNOSIS — K219 Gastro-esophageal reflux disease without esophagitis: Secondary | ICD-10-CM | POA: Diagnosis not present

## 2024-02-07 DIAGNOSIS — I10 Essential (primary) hypertension: Secondary | ICD-10-CM | POA: Diagnosis not present

## 2024-02-07 DIAGNOSIS — Z23 Encounter for immunization: Secondary | ICD-10-CM

## 2024-02-07 MED ORDER — MONTELUKAST SODIUM 10 MG PO TABS: 10.0000 mg | ORAL_TABLET | Freq: Every day | ORAL | 1 refills | Status: AC

## 2024-02-07 MED ORDER — BENAZEPRIL HCL 20 MG PO TABS: 20.0000 mg | ORAL_TABLET | Freq: Every day | ORAL | 1 refills | Status: AC

## 2024-02-07 MED ORDER — ALBUTEROL SULFATE HFA 108 (90 BASE) MCG/ACT IN AERS: 2.0000 | INHALATION_SPRAY | Freq: Four times a day (QID) | RESPIRATORY_TRACT | 6 refills | Status: AC | PRN

## 2024-02-07 MED ORDER — ATORVASTATIN CALCIUM 40 MG PO TABS: 40.0000 mg | ORAL_TABLET | Freq: Every day | ORAL | 1 refills | Status: AC

## 2024-02-07 MED ORDER — OMEPRAZOLE 40 MG PO CPDR
40.0000 mg | DELAYED_RELEASE_CAPSULE | Freq: Every day | ORAL | 2 refills | Status: AC
Start: 2024-02-07 — End: ?

## 2024-02-07 NOTE — Assessment & Plan Note (Signed)
 Under good control on current regimen. Continue current regimen. Continue to monitor. Call with any concerns. Refills given. Labs drawn today.

## 2024-02-07 NOTE — Progress Notes (Signed)
 BP 127/74 (BP Location: Left Arm, Patient Position: Sitting, Cuff Size: Small)   Pulse 74   Temp 97.9 F (36.6 C) (Oral)   Ht 5' 9 (1.753 m)   Wt 176 lb 6.4 oz (80 kg)   SpO2 97%   BMI 26.05 kg/m    Subjective:    Patient ID: Carl Fernandez, male    DOB: Sep 23, 1940, 83 y.o.   MRN: [id number]  HPI: Carl Fernandez is a 83 y.o. male  Chief Complaint  Patient presents with   Hypertension   Weight Loss   HYPERTENSION / HYPERLIPIDEMIA Satisfied with current treatment? yes Duration of hypertension: chronic BP monitoring frequency: not checking BP medication side effects: no Past BP meds: benazepri Duration of hyperlipidemia: chronic Cholesterol medication side effects: no Cholesterol supplements: none Past cholesterol medications: atorvastatin  Medication compliance: excellent compliance Aspirin: no Recent stressors: no Recurrent headaches: no Visual changes: no Palpitations: no Dyspnea: no Chest pain: no Lower extremity edema: no Dizzy/lightheaded: no  COPD COPD status: controlled Satisfied with current treatment?: yes Oxygen use: no Dyspnea frequency: rarely Cough frequency: rarely Rescue inhaler frequency: almost never   Limitation of activity: no Productive cough: no Pneumovax: Up to Date Influenza: Up to Date  Has lost about 15lbs in the past year by cutting back on what he's eating. He's feeling well. No concerns.    Relevant past medical, surgical, family and social history reviewed and updated as indicated. Interim medical history since our last visit reviewed. Allergies and medications reviewed and updated.  Review of Systems  Constitutional: Negative.   Respiratory: Negative.    Cardiovascular: Negative.   Musculoskeletal: Negative.   Neurological: Negative.   Psychiatric/Behavioral: Negative.      Per HPI unless specifically indicated above     Objective:    BP 127/74 (BP Location: Left Arm, Patient Position: Sitting, Cuff Size: Small)    Pulse 74   Temp 97.9 F (36.6 C) (Oral)   Ht 5' 9 (1.753 m)   Wt 176 lb 6.4 oz (80 kg)   SpO2 97%   BMI 26.05 kg/m   Wt Readings from Last 3 Encounters:  02/07/24 176 lb 6.4 oz (80 kg)  08/07/23 181 lb 3.2 oz (82.2 kg)  07/20/23 190 lb (86.2 kg)    Physical Exam Vitals and nursing note reviewed.  Constitutional:      General: He is not in acute distress.    Appearance: Normal appearance. He is not ill-appearing, toxic-appearing or diaphoretic.  HENT:     Head: Normocephalic and atraumatic.     Right Ear: External ear normal.     Left Ear: External ear normal.     Nose: Nose normal.     Mouth/Throat:     Mouth: Mucous membranes are moist.     Pharynx: Oropharynx is clear.  Eyes:     General: No scleral icterus.       Right eye: No discharge.        Left eye: No discharge.     Extraocular Movements: Extraocular movements intact.     Conjunctiva/sclera: Conjunctivae normal.     Pupils: Pupils are equal, round, and reactive to light.  Cardiovascular:     Rate and Rhythm: Normal rate and regular rhythm.     Pulses: Normal pulses.     Heart sounds: Normal heart sounds. No murmur heard.    No friction rub. No gallop.  Pulmonary:     Effort: Pulmonary effort is normal. No respiratory distress.  Breath sounds: Normal breath sounds. No stridor. No wheezing, rhonchi or rales.  Chest:     Chest wall: No tenderness.  Musculoskeletal:        General: Normal range of motion.     Cervical back: Normal range of motion and neck supple.  Skin:    General: Skin is warm and dry.     Capillary Refill: Capillary refill takes less than 2 seconds.     Coloration: Skin is not jaundiced or pale.     Findings: No bruising, erythema, lesion or rash.     Comments: Small well healing scratch on R arm  Neurological:     General: No focal deficit present.     Mental Status: He is alert and oriented to person, place, and time. Mental status is at baseline.  Psychiatric:        Mood and  Affect: Mood normal.        Behavior: Behavior normal.        Thought Content: Thought content normal.        Judgment: Judgment normal.     Results for orders placed or performed in visit on 08/07/23  Microalbumin, Urine Waived   Collection Time: 08/07/23  8:58 AM  Result Value Ref Range   Microalb, Ur Waived 80 (H) 0 - 19 mg/L   Creatinine, Urine Waived 300 10 - 300 mg/dL   Microalb/Creat Ratio <30 <30 mg/g  Comprehensive metabolic panel with GFR   Collection Time: 08/07/23  8:59 AM  Result Value Ref Range   Glucose 89 70 - 99 mg/dL   BUN 12 8 - 27 mg/dL   Creatinine, Ser 8.81 0.76 - 1.27 mg/dL   eGFR 61 >40 fO/fpw/8.26   BUN/Creatinine Ratio 10 10 - 24   Sodium 147 (H) 134 - 144 mmol/L   Potassium 4.4 3.5 - 5.2 mmol/L   Chloride 111 (H) 96 - 106 mmol/L   CO2 24 20 - 29 mmol/L   Calcium  8.8 8.6 - 10.2 mg/dL   Total Protein 6.1 6.0 - 8.5 g/dL   Albumin 4.1 3.7 - 4.7 g/dL   Globulin, Total 2.0 1.5 - 4.5 g/dL   Bilirubin Total 0.5 0.0 - 1.2 mg/dL   Alkaline Phosphatase 90 44 - 121 IU/L   AST 16 0 - 40 IU/L   ALT 10 0 - 44 IU/L  CBC with Differential/Platelet   Collection Time: 08/07/23  8:59 AM  Result Value Ref Range   WBC 5.9 3.4 - 10.8 x10E3/uL   RBC 4.34 4.14 - 5.80 x10E6/uL   Hemoglobin 12.6 (L) 13.0 - 17.7 g/dL   Hematocrit 60.8 62.4 - 51.0 %   MCV 90 79 - 97 fL   MCH 29.0 26.6 - 33.0 pg   MCHC 32.2 31.5 - 35.7 g/dL   RDW 87.6 88.3 - 84.5 %   Platelets 191 150 - 450 x10E3/uL   Neutrophils 69 Not Estab. %   Lymphs 19 Not Estab. %   Monocytes 8 Not Estab. %   Eos 3 Not Estab. %   Basos 1 Not Estab. %   Neutrophils Absolute 4.1 1.4 - 7.0 x10E3/uL   Lymphocytes Absolute 1.1 0.7 - 3.1 x10E3/uL   Monocytes Absolute 0.5 0.1 - 0.9 x10E3/uL   EOS (ABSOLUTE) 0.2 0.0 - 0.4 x10E3/uL   Basophils Absolute 0.0 0.0 - 0.2 x10E3/uL   Immature Granulocytes 0 Not Estab. %   Immature Grans (Abs) 0.0 0.0 - 0.1 x10E3/uL  Lipid Panel w/o Chol/HDL Ratio  Collection Time:  08/07/23  8:59 AM  Result Value Ref Range   Cholesterol, Total 91 (L) 100 - 199 mg/dL   Triglycerides 58 0 - 149 mg/dL   HDL 35 (L) >60 mg/dL   VLDL Cholesterol Cal 14 5 - 40 mg/dL   LDL Chol Calc (NIH) 42 0 - 99 mg/dL  TSH   Collection Time: 08/07/23  8:59 AM  Result Value Ref Range   TSH 1.730 0.450 - 4.500 uIU/mL      Assessment & Plan:   Problem List Items Addressed This Visit       Cardiovascular and Mediastinum   Essential hypertension - Primary   Under good control on current regimen. Continue current regimen. Continue to monitor. Call with any concerns. Refills given. Labs drawn today.       Relevant Medications   atorvastatin  (LIPITOR) 40 MG tablet   benazepril  (LOTENSIN ) 20 MG tablet   Other Relevant Orders   Comprehensive metabolic panel with GFR     Respiratory   COPD (chronic obstructive pulmonary disease) (HCC)   Under good control on current regimen. Continue current regimen. Continue to monitor. Call with any concerns. Refills given. Labs drawn today.        Relevant Medications   azithromycin (ZITHROMAX) 250 MG tablet   fluticasone  (FLONASE ) 50 MCG/ACT nasal spray   albuterol  (VENTOLIN  HFA) 108 (90 Base) MCG/ACT inhaler   montelukast  (SINGULAIR ) 10 MG tablet   Other Relevant Orders   CBC with Differential/Platelet   Comprehensive metabolic panel with GFR     Digestive   GERD (gastroesophageal reflux disease)   Under good control on current regimen. Continue current regimen. Continue to monitor. Call with any concerns. Refills given. Labs drawn today.       Relevant Medications   omeprazole  (PRILOSEC) 40 MG capsule     Other   Hyperlipidemia   Under good control on current regimen. Continue current regimen. Continue to monitor. Call with any concerns. Refills given. Labs drawn today.       Relevant Medications   atorvastatin  (LIPITOR) 40 MG tablet   benazepril  (LOTENSIN ) 20 MG tablet   Other Relevant Orders   Comprehensive metabolic panel  with GFR   Lipid Panel w/o Chol/HDL Ratio   Other Visit Diagnoses       Weight loss       With effort. Will check labs. Await results.   Relevant Orders   TSH     Open wound of right upper arm, initial encounter       Healing well. Due for Td- given today.   Relevant Orders   Td : Tetanus/diphtheria >7yo Preservative  free (Completed)     Needs flu shot       Flu shot given today.   Relevant Orders   Flu vaccine HIGH DOSE PF(Fluzone  Trivalent) (Completed)     Need for COVID-19 vaccine       Covid shot given today.   Relevant Orders   Pfizer Comirnaty Covid -19 Vaccine 51yrs and older (Completed)        Follow up plan: Return in about 6 months (around 08/07/2024) for physical.

## 2024-02-08 LAB — CBC WITH DIFFERENTIAL/PLATELET
Basophils Absolute: 0 x10E3/uL (ref 0.0–0.2)
Basos: 1 %
EOS (ABSOLUTE): 0.1 x10E3/uL (ref 0.0–0.4)
Eos: 3 %
Hematocrit: 40.8 % (ref 37.5–51.0)
Hemoglobin: 13.1 g/dL (ref 13.0–17.7)
Immature Grans (Abs): 0 x10E3/uL (ref 0.0–0.1)
Immature Granulocytes: 0 %
Lymphocytes Absolute: 1.2 x10E3/uL (ref 0.7–3.1)
Lymphs: 23 %
MCH: 29.2 pg (ref 26.6–33.0)
MCHC: 32.1 g/dL (ref 31.5–35.7)
MCV: 91 fL (ref 79–97)
Monocytes Absolute: 0.6 x10E3/uL (ref 0.1–0.9)
Monocytes: 11 %
Neutrophils Absolute: 3.2 x10E3/uL (ref 1.4–7.0)
Neutrophils: 62 %
Platelets: 185 x10E3/uL (ref 150–450)
RBC: 4.49 x10E6/uL (ref 4.14–5.80)
RDW: 12.6 % (ref 11.6–15.4)
WBC: 5.1 x10E3/uL (ref 3.4–10.8)

## 2024-02-08 LAB — COMPREHENSIVE METABOLIC PANEL WITH GFR
ALT: 10 IU/L (ref 0–44)
AST: 14 IU/L (ref 0–40)
Albumin: 4.1 g/dL (ref 3.7–4.7)
Alkaline Phosphatase: 83 IU/L (ref 48–129)
BUN/Creatinine Ratio: 11 (ref 10–24)
BUN: 14 mg/dL (ref 8–27)
Bilirubin Total: 0.8 mg/dL (ref 0.0–1.2)
CO2: 24 mmol/L (ref 20–29)
Calcium: 9 mg/dL (ref 8.6–10.2)
Chloride: 106 mmol/L (ref 96–106)
Creatinine, Ser: 1.27 mg/dL (ref 0.76–1.27)
Globulin, Total: 2.2 g/dL (ref 1.5–4.5)
Glucose: 85 mg/dL (ref 70–99)
Potassium: 4.2 mmol/L (ref 3.5–5.2)
Sodium: 144 mmol/L (ref 134–144)
Total Protein: 6.3 g/dL (ref 6.0–8.5)
eGFR: 56 mL/min/1.73 — ABNORMAL LOW (ref >59)

## 2024-02-08 LAB — LIPID PANEL W/O CHOL/HDL RATIO
Cholesterol, Total: 99 mg/dL — ABNORMAL LOW (ref 100–199)
HDL: 37 mg/dL — ABNORMAL LOW (ref >39)
LDL Chol Calc (NIH): 46 mg/dL (ref 0–99)
Triglycerides: 79 mg/dL (ref 0–149)
VLDL Cholesterol Cal: 16 mg/dL (ref 5–40)

## 2024-02-08 LAB — TSH: TSH: 3.13 u[IU]/mL (ref 0.450–4.500)

## 2024-02-09 ENCOUNTER — Ambulatory Visit: Payer: Self-pay | Admitting: Family Medicine

## 2024-03-16 ENCOUNTER — Other Ambulatory Visit: Payer: Self-pay | Admitting: Family Medicine

## 2024-03-16 DIAGNOSIS — J302 Other seasonal allergic rhinitis: Secondary | ICD-10-CM

## 2024-03-18 NOTE — Telephone Encounter (Signed)
 Requested Prescriptions  Pending Prescriptions Disp Refills   ALLERGY RELIEF 10 MG tablet [Pharmacy Med Name: LORATADINE  10 MG TABLET] 30 tablet 0    Sig: Take 1 tablet (10 mg total) by mouth daily as needed for allergies.     Ear, Nose, and Throat:  Antihistamines 2 Passed - 03/18/2024  2:44 PM      Passed - Cr in normal range and within 360 days    Creatinine, Ser  Date Value Ref Range Status  02/07/2024 1.27 0.76 - 1.27 mg/dL Final         Passed - Valid encounter within last 12 months    Recent Outpatient Visits           1 month ago Essential hypertension   [place] [facility] [place], Megan P, DO   7 months ago Essential hypertension   [place] [facility] [place], [place], DO

## 2024-03-19 DIAGNOSIS — G4733 Obstructive sleep apnea (adult) (pediatric): Secondary | ICD-10-CM | POA: Diagnosis not present

## 2024-03-19 DIAGNOSIS — J301 Allergic rhinitis due to pollen: Secondary | ICD-10-CM | POA: Diagnosis not present

## 2024-03-19 DIAGNOSIS — J4520 Mild intermittent asthma, uncomplicated: Secondary | ICD-10-CM | POA: Diagnosis not present

## 2024-04-21 ENCOUNTER — Other Ambulatory Visit: Payer: Self-pay | Admitting: Family Medicine

## 2024-04-21 DIAGNOSIS — J42 Unspecified chronic bronchitis: Secondary | ICD-10-CM

## 2024-04-24 NOTE — Telephone Encounter (Signed)
 Requested Prescriptions  Pending Prescriptions Disp Refills   fluticasone -salmeterol (ADVAIR  HFA) 115-21 MCG/ACT inhaler [Pharmacy Med Name: FLUTICASONE -SALMETEROL 115-21] 12 g 0    Sig: Inhale 2 puffs into the lungs 2 (two) times daily.     Pulmonology:  Combination Products Passed - 04/24/2024  8:45 AM      Passed - Valid encounter within last 12 months    Recent Outpatient Visits           2 months ago Essential hypertension   [place] [facility] [place], Megan P, DO   8 months ago Essential hypertension   [place] [facility] [place], [place], DO

## 2024-05-11 ENCOUNTER — Other Ambulatory Visit: Payer: Self-pay | Admitting: Family Medicine

## 2024-05-11 DIAGNOSIS — J302 Other seasonal allergic rhinitis: Secondary | ICD-10-CM

## 2024-05-13 NOTE — Telephone Encounter (Signed)
 Requested Prescriptions  Pending Prescriptions Disp Refills   ALLERGY RELIEF 10 MG tablet [Pharmacy Med Name: LORATADINE  10 MG TABLET] 90 tablet 0    Sig: Take 1 tablet (10 mg total) by mouth daily as needed for allergies.     Ear, Nose, and Throat:  Antihistamines 2 Passed - 05/13/2024  4:30 PM      Passed - Cr in normal range and within 360 days    Creatinine, Ser  Date Value Ref Range Status  02/07/2024 1.27 0.76 - 1.27 mg/dL Final         Passed - Valid encounter within last 12 months    Recent Outpatient Visits           3 months ago Essential hypertension   [place] [facility] [place], Megan P, DO   9 months ago Essential hypertension   [facility] [place], [place], DO

## 2024-05-21 ENCOUNTER — Other Ambulatory Visit: Payer: Self-pay | Admitting: Family Medicine

## 2024-05-21 DIAGNOSIS — J42 Unspecified chronic bronchitis: Secondary | ICD-10-CM

## 2024-08-01 ENCOUNTER — Ambulatory Visit

## 2024-08-08 ENCOUNTER — Encounter: Admitting: Family Medicine
